# Patient Record
Sex: Male | Born: 1965 | Race: Black or African American | Hispanic: No | Marital: Single | Smoking: Never smoker
Health system: Southern US, Community
[De-identification: ages and names within clinical notes are randomized; demographics above are authoritative.]

## PROBLEM LIST (undated history)

## (undated) DIAGNOSIS — L659 Nonscarring hair loss, unspecified: Secondary | ICD-10-CM

## (undated) HISTORY — DX: Nonscarring hair loss, unspecified: L65.9

## (undated) HISTORY — PX: ANTERIOR CRUCIATE LIGAMENT REPAIR: SHX115

## (undated) HISTORY — PX: LITHOTRIPSY: SUR834

---

## 2008-11-05 ENCOUNTER — Ambulatory Visit: Payer: Self-pay | Admitting: Internal Medicine

## 2009-03-28 ENCOUNTER — Ambulatory Visit: Payer: Self-pay | Admitting: Family Medicine

## 2012-01-27 ENCOUNTER — Ambulatory Visit: Payer: Self-pay | Admitting: Urology

## 2012-02-22 ENCOUNTER — Ambulatory Visit: Payer: Self-pay | Admitting: Urology

## 2012-05-18 ENCOUNTER — Ambulatory Visit: Payer: Self-pay | Admitting: Urology

## 2012-07-06 ENCOUNTER — Ambulatory Visit: Payer: Self-pay | Admitting: Urology

## 2013-07-14 IMAGING — CR DG ABDOMEN 1V
1 series · 2 of 2 positions shown · non-contrast
Comparison: none

REASON FOR EXAM: nephrolithiasis and renal colic
COMMENTS:

[Series 1: supine kub · 0.17mm/px · 2 of 2 slices shown]
[im 1/2]
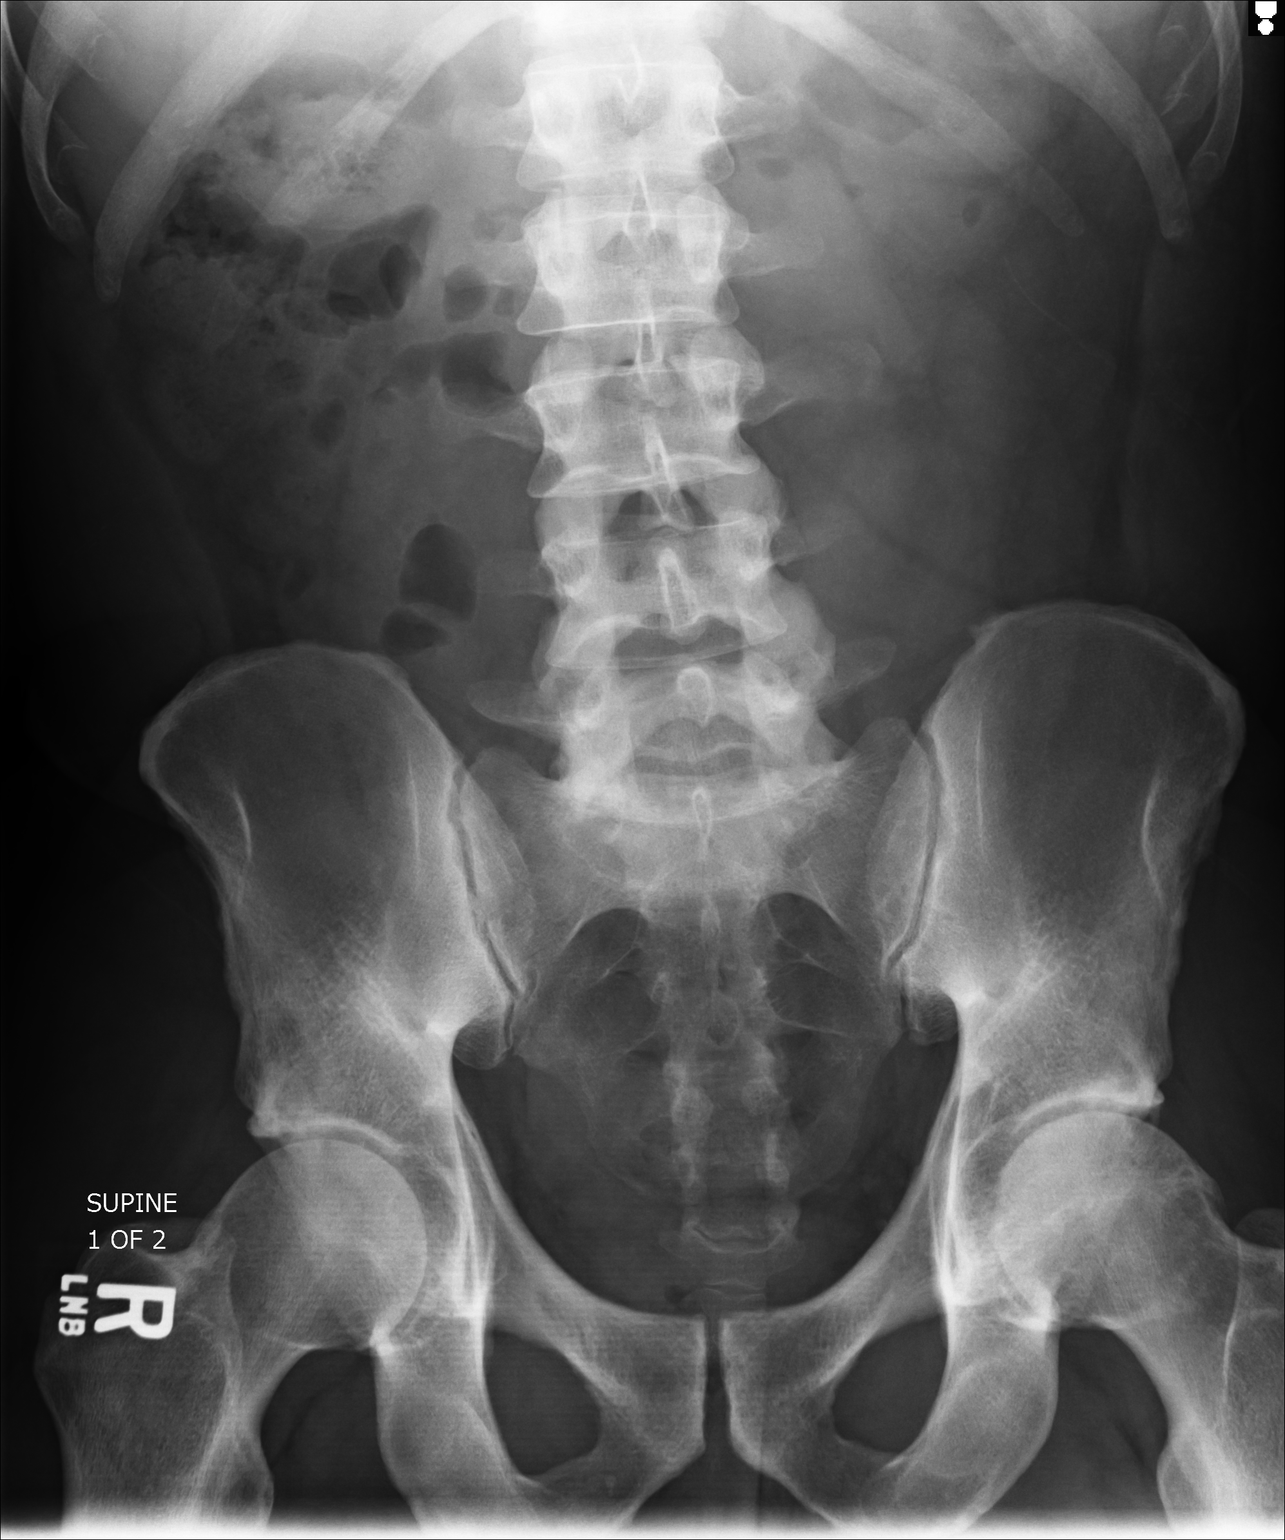
[im 2/2]
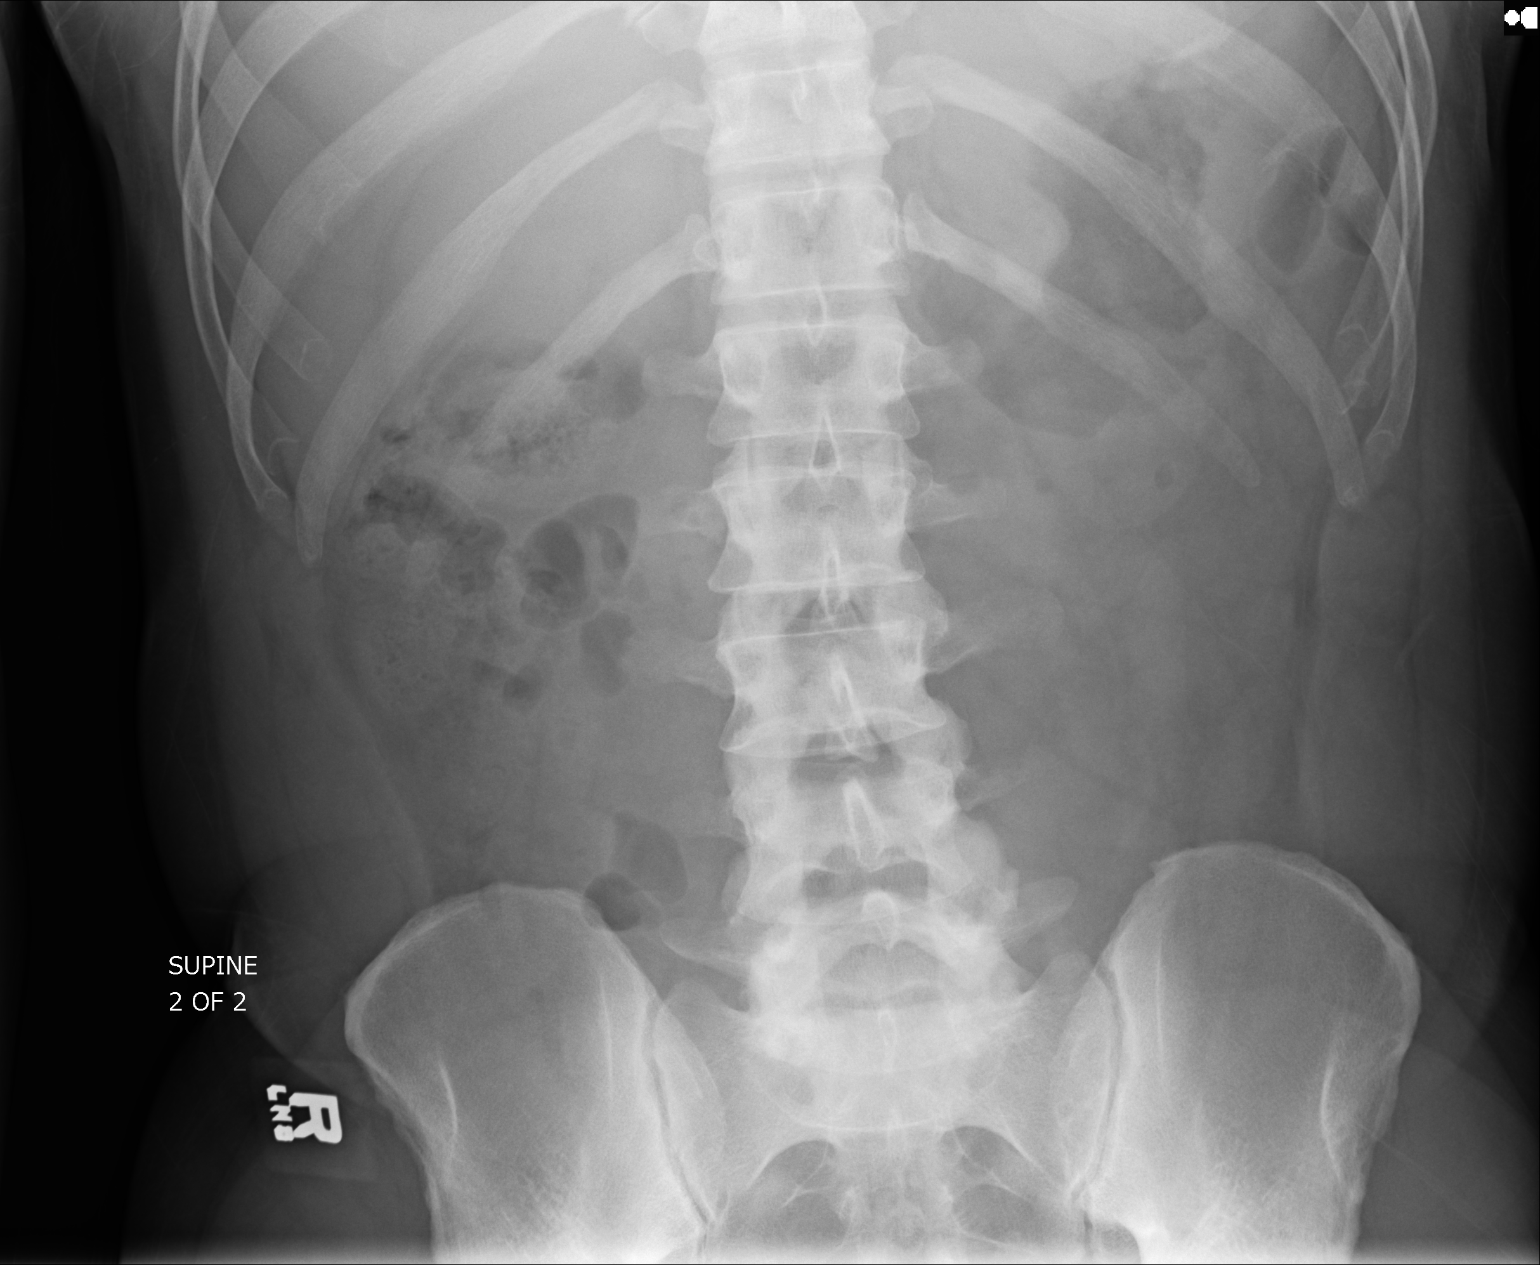

[2 of 2 positions shown; findings below may reference images not displayed]

PROCEDURE:     DXR - DXR KIDNEY URETER BLADDER  - May 18, 2012 [DATE]

RESULT:     Spinal alignment appears to be maintained. The bowel gas pattern
appears grossly normal without obstruction. Portions of the kidneys are not
well seen because of overlying fecal material and bowel gas. No definite
ureteral stones are evident. The previous right ureteral calculus is not
definitely seen.
IMPRESSION: No definite nephrolithiasis or ureteral calculi. CT and ultrasound are
available for further assessment as clinically indicated.

[REDACTED]

## 2014-05-15 ENCOUNTER — Ambulatory Visit: Payer: Self-pay | Admitting: Internal Medicine

## 2014-05-15 LAB — URINALYSIS, COMPLETE
BACTERIA: NEGATIVE
BILIRUBIN, UR: NEGATIVE
GLUCOSE, UR: NEGATIVE mg/dL (ref 0–75)
Ketone: NEGATIVE
NITRITE: NEGATIVE
PROTEIN: NEGATIVE
Ph: 7 (ref 4.5–8.0)
Specific Gravity: 1.005 (ref 1.003–1.030)

## 2014-07-02 ENCOUNTER — Ambulatory Visit: Payer: Self-pay | Admitting: Urology

## 2014-08-01 DIAGNOSIS — R310 Gross hematuria: Secondary | ICD-10-CM | POA: Insufficient documentation

## 2014-08-01 DIAGNOSIS — N200 Calculus of kidney: Secondary | ICD-10-CM | POA: Insufficient documentation

## 2014-08-14 DIAGNOSIS — N201 Calculus of ureter: Secondary | ICD-10-CM | POA: Insufficient documentation

## 2015-11-18 ENCOUNTER — Encounter: Payer: Self-pay | Admitting: Family Medicine

## 2015-11-19 ENCOUNTER — Ambulatory Visit (INDEPENDENT_AMBULATORY_CARE_PROVIDER_SITE_OTHER): Payer: BLUE CROSS/BLUE SHIELD | Admitting: Family Medicine

## 2015-11-19 ENCOUNTER — Encounter: Payer: Self-pay | Admitting: Family Medicine

## 2015-11-19 VITALS — BP 130/78 | HR 98 | Ht 72.0 in | Wt 168.0 lb

## 2015-11-19 DIAGNOSIS — Z1211 Encounter for screening for malignant neoplasm of colon: Secondary | ICD-10-CM | POA: Diagnosis not present

## 2015-11-19 DIAGNOSIS — L700 Acne vulgaris: Secondary | ICD-10-CM

## 2015-11-19 DIAGNOSIS — Z0000 Encounter for general adult medical examination without abnormal findings: Secondary | ICD-10-CM | POA: Diagnosis not present

## 2015-11-19 LAB — HEMOCCULT GUIAC POC 1CARD (OFFICE): FECAL OCCULT BLD: POSITIVE — AB

## 2015-11-19 MED ORDER — TRIAMCINOLONE ACETONIDE 0.1 % EX CREA: 1.0000 "application " | TOPICAL_CREAM | Freq: Two times a day (BID) | CUTANEOUS | Status: DC

## 2015-11-19 MED ORDER — CLOTRIMAZOLE-BETAMETHASONE 1-0.05 % EX CREA: TOPICAL_CREAM | Freq: Two times a day (BID) | CUTANEOUS | Status: DC

## 2015-11-19 MED ORDER — TAZORAC 0.05 % EX CREA: TOPICAL_CREAM | Freq: Every day | CUTANEOUS | Status: DC

## 2015-11-19 NOTE — Progress Notes (Signed)
Name: Joshuwa Vecchio   MRN: [id number]    DOB: 06/28/1966   Date:11/19/2015       Progress Note  Subjective  Chief Complaint  Chief Complaint  Patient presents with  . Annual Exam    HPI Comments: Patient for physical with no subjective/ objective.   No problem-specific assessment & plan notes found for this encounter.   Past Medical History  Diagnosis Date  . Alopecia     Past Surgical History  Procedure Laterality Date  . Anterior cruciate ligament repair Right   . Lithotripsy      History reviewed. No pertinent family history.  Social History   Social History  . Marital Status: Single    Spouse Name: N/A  . Number of Children: N/A  . Years of Education: N/A   Occupational History  . Not on file.   Social History Main Topics  . Smoking status: Never Smoker   . Smokeless tobacco: Not on file  . Alcohol Use: 0.0 oz/week    0 Standard drinks or equivalent per week  . Drug Use: No  . Sexual Activity: Yes   Other Topics Concern  . Not on file   Social History Narrative  . No narrative on file    No Known Allergies   Review of Systems  Constitutional: Negative.   HENT: Negative.   Eyes: Negative.   Respiratory: Negative.   Cardiovascular: Negative.   Gastrointestinal: Negative.   Genitourinary: Negative.   Musculoskeletal: Negative.   Skin: Positive for rash.  Neurological: Negative.   Endo/Heme/Allergies: Negative.   Psychiatric/Behavioral: Negative.      Objective  Filed Vitals:   11/19/15 0908  BP: 130/78  Pulse: 98  Height: 6' (1.829 m)  Weight: 168 lb (76.204 kg)    Physical Exam  Constitutional: He is oriented to person, place, and time and well-developed, well-nourished, and in no distress.  HENT:  Head: Normocephalic.  Right Ear: External ear normal.  Left Ear: External ear normal.  Nose: Nose normal.  Mouth/Throat: Oropharynx is clear and moist.  Eyes: Conjunctivae and EOM are normal. Pupils are equal, round, and  reactive to light. Right eye exhibits no discharge. Left eye exhibits no discharge. No scleral icterus.  Neck: Normal range of motion. Neck supple. No JVD present. No tracheal deviation present. No thyromegaly present.  Cardiovascular: Normal rate, regular rhythm, normal heart sounds and intact distal pulses.  Exam reveals no gallop and no friction rub.   No murmur heard. Pulmonary/Chest: Breath sounds normal. No respiratory distress. He has no wheezes. He has no rales.  Abdominal: Soft. Bowel sounds are normal. He exhibits no mass. There is no hepatosplenomegaly. There is no tenderness. There is no rebound, no guarding and no CVA tenderness.  Genitourinary: Prostate normal. Rectal exam shows no external hemorrhoid, no internal hemorrhoid, no mass and no tenderness. Guaiac positive stool.  Musculoskeletal: Normal range of motion. He exhibits no edema or tenderness.  Lymphadenopathy:    He has no cervical adenopathy.  Neurological: He is alert and oriented to person, place, and time. He has normal sensation, normal strength, normal reflexes and intact cranial nerves. No cranial nerve deficit.  Skin: Skin is warm. No rash noted.  Psychiatric: Mood and affect normal.      Assessment & Plan  Problem List Items Addressed This Visit      Musculoskeletal and Integument   Acne vulgaris    Other Visit Diagnoses    Annual physical exam    -  Primary    Relevant Orders    Lipid Profile    Renal Function Panel    POCT Occult Blood Stool (Completed)    Ambulatory referral to Gastroenterology    Colon cancer screening        Relevant Orders    Ambulatory referral to Gastroenterology         [facility]  11/19/2015

## 2015-12-10 LAB — RENAL FUNCTION PANEL
Albumin: 4.6 g/dL (ref 3.5–5.5)
BUN/Creatinine Ratio: 12 (ref 9–20)
BUN: 12 mg/dL (ref 6–24)
CHLORIDE: 101 mmol/L (ref 96–106)
CO2: 25 mmol/L (ref 18–29)
Calcium: 9.4 mg/dL (ref 8.7–10.2)
Creatinine, Ser: 1.03 mg/dL (ref 0.76–1.27)
GFR, EST AFRICAN AMERICAN: 97 mL/min/{1.73_m2} (ref >59)
GFR, EST NON AFRICAN AMERICAN: 84 mL/min/{1.73_m2} (ref >59)
Glucose: 83 mg/dL (ref 65–99)
POTASSIUM: 4.5 mmol/L (ref 3.5–5.2)
Phosphorus: 3.5 mg/dL (ref 2.5–4.5)
SODIUM: 142 mmol/L (ref 134–144)

## 2015-12-10 LAB — LIPID PANEL
CHOL/HDL RATIO: 3.1 ratio (ref 0.0–5.0)
Cholesterol, Total: 120 mg/dL (ref 100–199)
HDL: 39 mg/dL — ABNORMAL LOW (ref >39)
LDL Calculated: 59 mg/dL (ref 0–99)
Triglycerides: 108 mg/dL (ref 0–149)
VLDL CHOLESTEROL CAL: 22 mg/dL (ref 5–40)

## 2015-12-11 ENCOUNTER — Other Ambulatory Visit: Payer: Self-pay

## 2016-07-22 ENCOUNTER — Other Ambulatory Visit: Payer: Self-pay

## 2016-07-23 ENCOUNTER — Ambulatory Visit (INDEPENDENT_AMBULATORY_CARE_PROVIDER_SITE_OTHER): Payer: BLUE CROSS/BLUE SHIELD | Admitting: Family Medicine

## 2016-07-23 ENCOUNTER — Encounter: Payer: Self-pay | Admitting: Family Medicine

## 2016-07-23 VITALS — BP 138/78 | HR 78 | Ht 72.0 in | Wt 167.0 lb

## 2016-07-23 DIAGNOSIS — N529 Male erectile dysfunction, unspecified: Secondary | ICD-10-CM

## 2016-07-23 DIAGNOSIS — N410 Acute prostatitis: Secondary | ICD-10-CM | POA: Diagnosis not present

## 2016-07-23 MED ORDER — SULFAMETHOXAZOLE-TRIMETHOPRIM 800-160 MG PO TABS: 1.0000 | ORAL_TABLET | Freq: Two times a day (BID) | ORAL | 0 refills | Status: DC

## 2016-07-23 NOTE — Progress Notes (Signed)
Name: James Goodwin   MRN: [id number]    DOB: 1966/11/14   Date:07/23/2016       Progress Note  Subjective  Chief Complaint  Chief Complaint  Patient presents with  . Erectile Dysfunction    Erectile Dysfunction  This is a new problem. The current episode started 1 to 4 weeks ago. The problem has been gradually worsening since onset. The nature of his difficulty is achieving erection and maintaining erection. He reports no anxiety. Irritative symptoms do not include frequency or urgency. Obstructive symptoms include an intermittent stream, a slower stream and a weak stream. Pertinent negatives include no chills, dysuria, hematuria or inability to urinate. Past treatments include sildenafil and tadalafil.    No problem-specific Assessment & Plan notes found for this encounter.   Past Medical History:  Diagnosis Date  . Alopecia     Past Surgical History:  Procedure Laterality Date  . ANTERIOR CRUCIATE LIGAMENT REPAIR Right   . LITHOTRIPSY      Family History  Problem Relation Age of Onset  . Stroke Maternal Grandmother     Social History   Social History  . Marital status: Single    Spouse name: N/A  . Number of children: N/A  . Years of education: N/A   Occupational History  . Not on file.   Social History Main Topics  . Smoking status: Never Smoker  . Smokeless tobacco: Never Used  . Alcohol use 0.0 oz/week  . Drug use: No  . Sexual activity: Yes   Other Topics Concern  . Not on file   Social History Narrative  . No narrative on file    No Known Allergies   Review of Systems  Constitutional: Negative for chills, fever, malaise/fatigue and weight loss.  HENT: Negative for ear discharge, ear pain and sore throat.   Eyes: Negative for blurred vision.  Respiratory: Negative for cough, sputum production, shortness of breath and wheezing.   Cardiovascular: Negative for chest pain, palpitations and leg swelling.  Gastrointestinal: Negative for  abdominal pain, blood in stool, constipation, diarrhea, heartburn, melena and nausea.  Genitourinary: Negative for dysuria, frequency, hematuria and urgency.  Musculoskeletal: Negative for back pain, joint pain, myalgias and neck pain.  Skin: Negative for rash.  Neurological: Negative for dizziness, tingling, sensory change, focal weakness and headaches.  Endo/Heme/Allergies: Negative for environmental allergies and polydipsia. Does not bruise/bleed easily.  Psychiatric/Behavioral: Negative for depression and suicidal ideas. The patient is not nervous/anxious and does not have insomnia.      Objective  Vitals:   07/23/16 1349  BP: 138/78  Pulse: 78  Weight: 167 lb (75.8 kg)  Height: 6' (1.829 m)    Physical Exam  Constitutional: He is oriented to person, place, and time and well-developed, well-nourished, and in no distress.  HENT:  Head: Normocephalic.  Right Ear: External ear normal.  Left Ear: External ear normal.  Nose: Nose normal.  Mouth/Throat: Oropharynx is clear and moist.  Eyes: Conjunctivae and EOM are normal. Pupils are equal, round, and reactive to light. Right eye exhibits no discharge. Left eye exhibits no discharge. No scleral icterus.  Neck: Normal range of motion. Neck supple. No JVD present. No tracheal deviation present. No thyromegaly present.  Cardiovascular: Normal rate, regular rhythm, normal heart sounds and intact distal pulses.  Exam reveals no gallop and no friction rub.   No murmur heard. Pulmonary/Chest: Breath sounds normal. No respiratory distress. He has no wheezes. He has no rales.  Abdominal: Soft. Bowel sounds  are normal. He exhibits no mass. There is no hepatosplenomegaly. There is no tenderness. There is no rebound, no guarding and no CVA tenderness.  Genitourinary: Rectum normal. Prostate is enlarged and tender. He exhibits no abnormal testicular mass, no testicular tenderness, no abnormal scrotal mass and no scrotal tenderness.   Musculoskeletal: Normal range of motion. He exhibits no edema or tenderness.  Lymphadenopathy:    He has no cervical adenopathy.  Neurological: He is alert and oriented to person, place, and time. He has normal sensation, normal strength, normal reflexes and intact cranial nerves. No cranial nerve deficit.  Skin: Skin is warm. No rash noted.  Psychiatric: Mood and affect normal.  Nursing note and vitals reviewed.     Assessment & Plan  Problem List Items Addressed This Visit    None    Visit Diagnoses    Erectile dysfunction, unspecified erectile dysfunction type    -  Primary   Relevant Orders   Testosterone, Free, Total, SHBG   Acute prostatitis       Relevant Medications   sulfamethoxazole-trimethoprim (BACTRIM DS,SEPTRA DS) 800-160 MG tablet        Dr. [facility]  07/23/16

## 2016-07-24 LAB — TESTOSTERONE, FREE, TOTAL, SHBG
SEX HORMONE BINDING: 73 nmol/L (ref 19.3–76.4)
TESTOSTERONE FREE: 7.9 pg/mL (ref 7.2–24.0)
Testosterone: 657 ng/dL (ref 264–916)

## 2016-07-27 ENCOUNTER — Other Ambulatory Visit: Payer: Self-pay

## 2016-07-27 DIAGNOSIS — N529 Male erectile dysfunction, unspecified: Secondary | ICD-10-CM

## 2016-10-22 ENCOUNTER — Other Ambulatory Visit: Payer: Self-pay

## 2016-10-22 ENCOUNTER — Telehealth: Payer: Self-pay

## 2016-10-22 NOTE — Telephone Encounter (Signed)
Gastroenterology Pre-Procedure Review  Request Date: 11/30/16 Requesting Physician: Dr. Yetta BarreJones  PATIENT REVIEW QUESTIONS: The patient responded to the following health history questions as indicated:    1. Are you having any GI issues? no 2. Do you have a personal history of Polyps? no 3. Do you have a family history of Colon Cancer or Polyps? no 4. Diabetes Mellitus? no 5. Joint replacements in the past 12 months?no 6. Major health problems in the past 3 months?no 7. Any artificial heart valves, MVP, or defibrillator?no    MEDICATIONS & ALLERGIES:    Patient reports the following regarding taking any anticoagulation/antiplatelet therapy:   Plavix, Coumadin, Eliquis, Xarelto, Lovenox, Pradaxa, Brilinta, or Effient? no Aspirin? no  Patient confirms/reports the following medications:  Current Outpatient Prescriptions  Medication Sig Dispense Refill  . clotrimazole-betamethasone (LOTRISONE) cream Apply topically 2 (two) times daily. 60 g 11  . sulfamethoxazole-trimethoprim (BACTRIM DS,SEPTRA DS) 800-160 MG tablet Take 1 tablet by mouth 2 (two) times daily. 28 tablet 0  . TAZORAC 0.05 % cream Apply topically at bedtime. Hair club for hair growth 30 g 11  . triamcinolone cream (KENALOG) 0.1 % Apply 1 application topically 2 (two) times daily. 30 g 11   No current facility-administered medications for this visit.     Patient confirms/reports the following allergies:  No Known Allergies  No orders of the defined types were placed in this encounter.   AUTHORIZATION INFORMATION Primary Insurance: 1D#: Group #:  Secondary Insurance: 1D#: Group #:  SCHEDULE INFORMATION: Date: 11/30/16 Time: Location: [place]

## 2016-10-22 NOTE — Telephone Encounter (Signed)
Pt [facility]. Please precert.

## 2016-10-23 NOTE — Telephone Encounter (Signed)
No pre cert is required with [employer].

## 2016-11-26 ENCOUNTER — Telehealth: Payer: Self-pay | Admitting: Gastroenterology

## 2016-11-26 NOTE — Discharge Instructions (Signed)

## 2016-11-26 NOTE — Telephone Encounter (Signed)
Patient left a voice message to cancel colonoscopy monday

## 2016-11-30 ENCOUNTER — Encounter: Admission: RE | Payer: Self-pay | Source: Ambulatory Visit

## 2016-11-30 ENCOUNTER — Ambulatory Visit
Admission: RE | Admit: 2016-11-30 | Payer: BLUE CROSS/BLUE SHIELD | Source: Ambulatory Visit | Admitting: Gastroenterology

## 2016-11-30 SURGERY — COLONOSCOPY WITH PROPOFOL
Anesthesia: Choice

## 2017-01-28 ENCOUNTER — Encounter: Payer: Self-pay | Admitting: Family Medicine

## 2017-01-28 ENCOUNTER — Ambulatory Visit: Payer: BLUE CROSS/BLUE SHIELD | Admitting: Family Medicine

## 2017-01-28 ENCOUNTER — Ambulatory Visit (INDEPENDENT_AMBULATORY_CARE_PROVIDER_SITE_OTHER): Payer: No Typology Code available for payment source | Admitting: Family Medicine

## 2017-01-28 ENCOUNTER — Other Ambulatory Visit: Payer: Self-pay

## 2017-01-28 VITALS — BP 120/80 | HR 70 | Ht 72.0 in | Wt 175.0 lb

## 2017-01-28 DIAGNOSIS — Z3009 Encounter for other general counseling and advice on contraception: Secondary | ICD-10-CM

## 2017-01-28 DIAGNOSIS — M25561 Pain in right knee: Secondary | ICD-10-CM

## 2017-01-28 NOTE — Patient Instructions (Signed)
Vasectomy A vasectomy is tying (with or without cutting) the tube that collects the sperm from the testicle (vas deferens). The vasectomy blocks the sperm from going through the vas deferens and penis so that during sexual intercourse, the sperm does not go into the vagina. Vasectomy is safe, with very rare complications. It does not affect your sexual desire or performance. A vasectomy does not prevent sexually transmitted diseases. Because vasectomy is considered permanent, you should not have it done until you are sure you do not want any more children. You and your partner should be in full agreement to have the procedure. Your decision to have a vasectomy should not be made during a stressful situation. This includes loss of a pregnancy, illness, death of a spouse, or divorce. There are other means of contraception that can be used until you are completely sure you want this procedure done.  LET YOUR HEALTH CARE PROVIDER KNOW ABOUT:   Any allergies you have.  All medicines you are taking, including vitamins, herbs, eye drops, creams, and over-the-counter medicines.  Previous problems you or members of your family have had with the use of anesthetics.  Any blood disorders you have.  Previous surgeries you have had.  Medical conditions you have. RISKS AND COMPLICATIONS Generally, vasectomy is a safe procedure. However, as with any procedure, complications can occur. Possible complications include:  Failure of the procedure to cause infertility. This means you would still be able to get a male pregnant. Even after sterilization has been achieved, there is a 1 in 10,000 chance that the two cut ends may reconnect (recanalization).  Infection. A germ starts growing in the wound. This can usually be treated with antibiotic medicine(s).  An allergic reaction to the anesthetic or other medicine given.  Bleeding. Blood may seep under the skin so that the scrotum and penis appear to be bruised.  Sometimes the scrotum can swell and get the size of a grapefruit. This usually disappears without treatment within a week or two. BEFORE THE PROCEDURE  Do not take aspirin or aspirin-containing products for 7 days prior to your procedure.  Do not take nonsteroidal anti-inflammatory products for 7 days prior to your procedure.  You may be instructed to wash with soap before coming in for your procedure. PROCEDURE  The scrotum is cleaned with bacteria-killing soap, and the health care provider finds the vas deferens.  Each side of the scrotum is numbed.  A very small cut (incision) is made, and the vas deferens are pulled out of the scrotum. The vas deferens are then tied off, cut, or may be burned (cauterized) at the ends.  Sometimes the vas deferens are pulled out from the scrotum through a puncture wound. This is done with a special instrument without an incision.  The vas deferens are then put back into the scrotum, and the incision or puncture wound is closed. Absorbable suture material that will dissolve and not need to be removed is commonly used.  After surgery, sperm may still be left in the vas deferens for 1-3 months. Because of this, other means of contraception should be used until your health care provider examines you and finds there are no sperm in your seminal fluid. AFTER THE PROCEDURE After the procedure, you will be taken to the recovery area. Your progress will be watched and checked. Once you are awake, stable, and taking fluids well, you will be allowed to go home as long as there are no problems.  This information is   not intended to replace advice given to you by your health care provider. Make sure you discuss any questions you have with your health care provider. Document Released: 03/06/2003 Document Revised: 12/19/2013 Document Reviewed: 07/03/2013 Elsevier Interactive Patient Education  2017 [employer]

## 2017-01-28 NOTE — Progress Notes (Signed)
Name: James Goodwin   MRN: [id number]    DOB: 1966-05-31   Date:01/28/2017       Progress Note  Subjective  Chief Complaint  Chief Complaint  Patient presents with  . Knee Pain    was laying in bed Sunday night and felt a pop in R) knee. Tried to stand up- pain was 8 out of 10. Since then, has just been a 2/ more soreness than pain. Does 4 sets of 250 sit ups at a time every day. Can now only do 3 sets before having to stop    Knee Pain   The incident occurred at home. The injury mechanism is unknown (while sleeping /supineto lateral side). The pain is present in the right knee. The quality of the pain is described as aching. The pain is at a severity of 8/10 (at time of unknown injury). The pain is severe. The pain has been improving since onset. Pertinent negatives include no inability to bear weight, loss of motion, loss of sensation, muscle weakness, numbness or tingling. He reports no foreign bodies present. Nothing aggravates the symptoms. Treatments tried: sit ups. The treatment provided mild relief.    No problem-specific Assessment & Plan notes found for this encounter.   Past Medical History:  Diagnosis Date  . Alopecia     Past Surgical History:  Procedure Laterality Date  . ANTERIOR CRUCIATE LIGAMENT REPAIR Right   . LITHOTRIPSY      Family History  Problem Relation Age of Onset  . Stroke Maternal Grandmother     Social History   Social History  . Marital status: Single    Spouse name: N/A  . Number of children: N/A  . Years of education: N/A   Occupational History  . Not on file.   Social History Main Topics  . Smoking status: Never Smoker  . Smokeless tobacco: Never Used  . Alcohol use 0.0 oz/week  . Drug use: No  . Sexual activity: Yes   Other Topics Concern  . Not on file   Social History Narrative  . No narrative on file    No Known Allergies   Review of Systems  Constitutional: Negative for chills, fever, malaise/fatigue and weight  loss.  HENT: Negative for ear discharge, ear pain and sore throat.   Eyes: Negative for blurred vision.  Respiratory: Negative for cough, sputum production, shortness of breath and wheezing.   Cardiovascular: Negative for chest pain, palpitations and leg swelling.  Gastrointestinal: Negative for abdominal pain, blood in stool, constipation, diarrhea, heartburn, melena and nausea.  Genitourinary: Negative for dysuria, frequency, hematuria and urgency.  Musculoskeletal: Negative for back pain, joint pain, myalgias and neck pain.  Skin: Negative for rash.  Neurological: Negative for dizziness, tingling, sensory change, focal weakness, numbness and headaches.  Endo/Heme/Allergies: Negative for environmental allergies and polydipsia. Does not bruise/bleed easily.  Psychiatric/Behavioral: Negative for depression and suicidal ideas. The patient is not nervous/anxious and does not have insomnia.      Objective  Vitals:   01/28/17 1545  BP: 120/80  Pulse: 70  Weight: 175 lb (79.4 kg)  Height: 6' (1.829 m)    Physical Exam  Constitutional: He is oriented to person, place, and time and well-developed, well-nourished, and in no distress.  HENT:  Head: Normocephalic.  Right Ear: External ear normal.  Left Ear: External ear normal.  Nose: Nose normal.  Mouth/Throat: Oropharynx is clear and moist.  Eyes: Conjunctivae and EOM are normal. Pupils are equal, round, and reactive  to light. Right eye exhibits no discharge. Left eye exhibits no discharge. No scleral icterus.  Neck: Normal range of motion. Neck supple. No JVD present. No tracheal deviation present. No thyromegaly present.  Cardiovascular: Normal rate, regular rhythm, normal heart sounds and intact distal pulses.  Exam reveals no gallop and no friction rub.   No murmur heard. Pulmonary/Chest: Breath sounds normal. No respiratory distress. He has no wheezes. He has no rales.  Abdominal: Soft. Bowel sounds are normal. He exhibits no mass.  There is no hepatosplenomegaly. There is no tenderness. There is no rebound, no guarding and no CVA tenderness.  Musculoskeletal: Normal range of motion. He exhibits no edema or tenderness.       Right knee: He exhibits normal range of motion, no LCL laxity and no MCL laxity. No tenderness found. No medial joint line, no lateral joint line, no MCL, no LCL and no patellar tendon tenderness noted.  Lymphadenopathy:    He has no cervical adenopathy.  Neurological: He is alert and oriented to person, place, and time. He has normal sensation, normal strength, normal reflexes and intact cranial nerves. No cranial nerve deficit.  Skin: Skin is warm. No rash noted.  Psychiatric: Mood and affect normal.  Nursing note and vitals reviewed.     Assessment & Plan  Problem List Items Addressed This Visit    None    Visit Diagnoses    Right knee pain, unspecified chronicity    -  Primary   Relevant Orders   DG Knee Complete 4 Views Right   Vasectomy evaluation       Relevant Orders   Ambulatory referral to Urology        Dr. Elizabeth [facility]reanna Jones [facility]  01/28/17

## 2018-06-07 ENCOUNTER — Ambulatory Visit (INDEPENDENT_AMBULATORY_CARE_PROVIDER_SITE_OTHER): Payer: 59 | Admitting: Urology

## 2018-06-07 ENCOUNTER — Encounter: Payer: Self-pay | Admitting: Urology

## 2018-06-07 VITALS — BP 125/71 | HR 77 | Ht 72.0 in | Wt 175.6 lb

## 2018-06-07 DIAGNOSIS — Z87442 Personal history of urinary calculi: Secondary | ICD-10-CM | POA: Diagnosis not present

## 2018-06-07 DIAGNOSIS — Z3009 Encounter for other general counseling and advice on contraception: Secondary | ICD-10-CM

## 2018-06-07 MED ORDER — TAMSULOSIN HCL 0.4 MG PO CAPS: 0.4000 mg | ORAL_CAPSULE | Freq: Every day | ORAL | 0 refills | Status: DC

## 2018-06-07 NOTE — Progress Notes (Signed)
06/07/2018 4:10 PM   James Goodwin [birth year] [id number]  Referring provider: Duanne Limerick, MD [address] [place], [place] [address]  Chief Complaint  Patient presents with  . Establish Care    HPI: 52 year old male with a history of stone disease.  He underwent shockwave lithotripsy of a right ureteral calculus in 2016.  He states over the weekend he was having mild to moderate cramping in his left flank region associated with nausea and 2 episodes of emesis.  His pain and nausea has resolved.  He recently remarried and is interested in a vasectomy as a means of permanent sterilization.   PMH: Past Medical History:  Diagnosis Date  . Alopecia     Surgical History: Past Surgical History:  Procedure Laterality Date  . ANTERIOR CRUCIATE LIGAMENT REPAIR Right   . LITHOTRIPSY      Home Medications:  Allergies as of 06/07/2018   No Known Allergies     Medication List    as of 06/07/2018  4:10 PM   You have not been prescribed any medications.     Allergies: No Known Allergies  Family History: Family History  Problem Relation Age of Onset  . Stroke Maternal Grandmother     Social History:  reports that he has never smoked. He has never used smokeless tobacco. He reports that he drinks alcohol. He reports that he does not use drugs.  ROS: UROLOGY Frequent Urination?: No Hard to postpone urination?: No Burning/pain with urination?: No Get up at night to urinate?: No Leakage of urine?: No Urine stream starts and stops?: No Trouble starting stream?: No Do you have to strain to urinate?: No Blood in urine?: No Urinary tract infection?: No Sexually transmitted disease?: No Injury to kidneys or bladder?: No Painful intercourse?: No Weak stream?: No Erection problems?: No Penile pain?: No  Gastrointestinal Nausea?: Yes Vomiting?: Yes Indigestion/heartburn?: No Diarrhea?: No Constipation?: No  Constitutional Fever: No Night  sweats?: No Weight loss?: No Fatigue?: No  Skin Skin rash/lesions?: No Itching?: No  Eyes Blurred vision?: No Double vision?: No  Ears/Nose/Throat Sore throat?: No Sinus problems?: No  Hematologic/Lymphatic Swollen glands?: No Easy bruising?: No  Cardiovascular Leg swelling?: No Chest pain?: No  Respiratory Cough?: No Shortness of breath?: No  Endocrine Excessive thirst?: No  Musculoskeletal Back pain?: Yes Joint pain?: No  Neurological Headaches?: No Dizziness?: No  Psychologic Depression?: No Anxiety?: No  Physical Exam: BP 125/71 (BP Location: Right Arm, Patient Position: Sitting, Cuff Size: Normal)   Pulse 77   Ht 6' (1.829 m)   Wt 175 lb 9.6 oz (79.7 kg)   SpO2 99%   BMI 23.82 kg/m   Constitutional:  Alert and oriented, No acute distress. HEENT: [place] AT, moist mucus membranes.  Trachea midline, no masses. Cardiovascular: No clubbing, cyanosis, or edema. Respiratory: Normal respiratory effort, no increased work of breathing. GI: Abdomen is soft, nontender, nondistended, no abdominal masses GU: No CVA tenderness Lymph: No cervical or inguinal lymphadenopathy. Skin: No rashes, bruises or suspicious lesions. Neurologic: Grossly intact, no focal deficits, moving all 4 extremities. Psychiatric: Normal mood and affect.   Assessment & Plan:    1. Personal history of kidney stones A KUB was ordered and if no obvious stone will obtain either a renal ultrasound or stone protocol CT based on his symptoms.  2.  Undesired fertility We had a long discussion about vasectomy. We specifically discussed the procedure, recovery and the risks, benefits and alternatives of vasectomy. I explained  that the procedure entails removal of a segment of each vas deferens, each of which conducts sperm, and that the purpose of this procedure is to cause sterility (inability to produce children or cause pregnancy). Vasectomy is intended to be permanent and irreversible form of  contraception. Options for fertility after vasectomy include vasectomy reversal, or sperm retrieval with in vitro fertilization. These options are not always successful, and they may be expensive. We discussed reversible forms of birth control such as condoms, IUD or diaphragms, as well as the option of freezing sperm in a sperm bank prior to the vasectomy procedure. We discussed the importance of avoiding strenuous exercise for four days after vasectomy, and the importance of refraining from any form of ejaculation for seven days after vasectomy. I explained that vasectomy does not produce immediate sterility so another form of contraceptive must be used until sterility is assured by having semen checked for sperm. Thus, a post vasectomy semen analysis is necessary to confirm sterility. Rarely, vasectomy must be repeated. We discussed the approximately 1 in 2,000 risk of pregnancy after vasectomy for men who have post-vasectomy semen analysis showing absent sperm or rare non-motile sperm. Typical side effects include a small amount of oozing blood, some discomfort and mild swelling in the area of incision.  Vasectomy does not affect sexual performance, function, please, sensation, interest, desire, satisfaction, penile erection, volume of semen or ejaculation. Other rare risks include allergy or adverse reaction to an anesthetic, testicular atrophy, hematoma, infection/abscess, prolonged tenderness of the vas deferens, pain, swelling, painful nodule or scar (called sperm granuloma) or epididymtis. We discussed chronic testicular pain syndrome. This has been reported to occur in as many as 1-2% of men and may be permanent. This can be treated with medication, small procedures or (rarely) surgery.     Riki AltesScott C Rigby Leonhardt, MD  [facility] [address], [address] [place], [place] [address] [phone]

## 2018-06-13 ENCOUNTER — Ambulatory Visit
Admission: RE | Admit: 2018-06-13 | Discharge: 2018-06-13 | Disposition: A | Discharge status: Home / Self Care | Payer: No Typology Code available for payment source | Source: Ambulatory Visit | Attending: Urology | Admitting: Urology

## 2018-06-13 DIAGNOSIS — Z87442 Personal history of urinary calculi: Secondary | ICD-10-CM | POA: Insufficient documentation

## 2018-06-14 ENCOUNTER — Telehealth: Payer: Self-pay

## 2018-06-14 ENCOUNTER — Other Ambulatory Visit: Payer: Self-pay | Admitting: Urology

## 2018-06-14 DIAGNOSIS — R109 Unspecified abdominal pain: Secondary | ICD-10-CM

## 2018-06-14 NOTE — Telephone Encounter (Signed)
Patient notified and is going to do the CT scan.

## 2018-06-14 NOTE — Telephone Encounter (Signed)
-----   Message from Riki AltesScott C Stoioff, MD sent at 06/14/2018  7:59 AM EDT ----- There was stool and bowel gas obscuring the renal outlines on KUB and difficult to tell if he has renal calculi.  Would recommend scheduling a noncontrast CT of the abdomen and pelvis.  I went ahead and entered the order.

## 2018-06-14 NOTE — Telephone Encounter (Signed)
-----   Message from Scott C Stoioff, MD sent at 06/14/2018  7:59 AM EDT ----- There was stool and bowel gas obscuring the renal outlines on KUB and difficult to tell if he has renal calculi.  Would recommend scheduling a noncontrast CT of the abdomen and pelvis.  I went ahead and entered the order. 

## 2018-06-20 ENCOUNTER — Ambulatory Visit
Admission: RE | Admit: 2018-06-20 | Discharge: 2018-06-20 | Disposition: A | Discharge status: Home / Self Care | Payer: No Typology Code available for payment source | Source: Ambulatory Visit | Attending: Urology | Admitting: Urology

## 2018-06-20 DIAGNOSIS — R109 Unspecified abdominal pain: Secondary | ICD-10-CM

## 2018-06-20 DIAGNOSIS — N132 Hydronephrosis with renal and ureteral calculous obstruction: Secondary | ICD-10-CM | POA: Diagnosis not present

## 2018-06-20 DIAGNOSIS — N134 Hydroureter: Secondary | ICD-10-CM | POA: Insufficient documentation

## 2018-06-21 ENCOUNTER — Encounter: Payer: Self-pay | Admitting: Urology

## 2018-06-21 ENCOUNTER — Telehealth: Payer: Self-pay | Admitting: Urology

## 2018-06-21 NOTE — Telephone Encounter (Signed)
Pt called to find out results of his CT scan.  He had it yesterday.  Please give pt a call at [phone]

## 2018-06-22 ENCOUNTER — Encounter: Payer: Self-pay | Admitting: Urology

## 2018-06-22 ENCOUNTER — Telehealth: Payer: Self-pay | Admitting: Radiology

## 2018-06-22 NOTE — Telephone Encounter (Signed)
Patient called with frustration that he has not received an answer regarding his CT results.  Explained Dr Heywood FootmanStoioff's note below & advised he will be called back to schedule surgery once discussed with Dr Lonna CobbStoioff. Questions answered. Patient voices understanding.   Notes recorded by Riki AltesStoioff, Scott C, MD on 06/22/2018 at 7:57 AM EDT CT was reviewed and shows a 7 mm stone in the distal ureter near the bladder and a 3 mm stone in the proximal ureter. These were not seen on KUB and would be unable to treat shockwave lithotripsy. Ureteroscopy would be the best option. It is less likely a 7 mm stone will pass.

## 2018-06-22 NOTE — Telephone Encounter (Signed)
-----   Message from Riki AltesScott C Stoioff, MD sent at 06/22/2018  7:57 AM EDT ----- CT was reviewed and shows a 7 mm stone in the distal ureter near the bladder and a 3 mm stone in the proximal ureter.  These were not seen on KUB and would be unable to treat shockwave lithotripsy.  Ureteroscopy would be the best option.  It is less likely a 7 mm stone will pass.

## 2018-06-22 NOTE — Telephone Encounter (Signed)
Addressed in mychart.

## 2018-06-24 ENCOUNTER — Telehealth: Payer: Self-pay | Admitting: Radiology

## 2018-06-24 MED ORDER — OXYCODONE-ACETAMINOPHEN 5-325 MG PO TABS
1.0000 | ORAL_TABLET | ORAL | 0 refills | Status: AC | PRN
Start: 2018-06-24 — End: 2018-06-29

## 2018-06-24 NOTE — Telephone Encounter (Signed)
Patient has been scheduled for ureteroscopy, laser lithotripsy, ureteral stent placement on 07/12/2018. Pt aware. Instructions given. Questions answered. Patient voices understanding.

## 2018-06-24 NOTE — Telephone Encounter (Signed)
Made patient aware of script sent to pharmacy. Patient voices understanding. 

## 2018-06-24 NOTE — Telephone Encounter (Signed)
Patient reports severe pain that is unrelieved by alternating ibuprofen & acetaminophen. Advised patient that if pain becomes unbearable or becomes feverish he should go to the emergency room.   Requests pain medication. Please advise.

## 2018-06-24 NOTE — Telephone Encounter (Signed)
Rx Percocet was sent to his pharmacy

## 2018-06-27 ENCOUNTER — Other Ambulatory Visit: Payer: Self-pay | Admitting: Radiology

## 2018-06-27 DIAGNOSIS — N201 Calculus of ureter: Secondary | ICD-10-CM

## 2018-07-04 NOTE — Telephone Encounter (Signed)
LMOM to notify patient of pre-admit testing appointment & post op appointment with Dr Lonna CobbStoioff. Patient will need to have renal ultrasound prior to this appointment.

## 2018-07-05 ENCOUNTER — Telehealth: Payer: Self-pay | Admitting: Radiology

## 2018-07-05 NOTE — Telephone Encounter (Signed)
Patient states he knows he has multiple stones but has passed a stone that he will bring to the office for analysis.  He is currently scheduled for URS on 07/12/2018. Do you want him to have imaging prior to surgery?

## 2018-07-06 NOTE — Telephone Encounter (Signed)
Explained Dr Heywood FootmanStoioff's note below to patient. Patient would like to f/u with RUS prior. Advised patient to keep appointment already scheduled for post op & hospital will contact him to schedule the RUS. Patient states he will bring stone to office for analysis & voices understanding of conversation with no further questions at this time.

## 2018-07-06 NOTE — Telephone Encounter (Signed)
His most distal ureteral stone was a 7 mm calculus.  He also had a 3 mm ureteral calculus proximal to the larger one.  If he passed the 7 mm calculus he should be able to pass to 3 mm.  The calculi in his kidneys were very small.  If he wants to hold off on ureteroscopy would get a follow-up ultrasound to see if his hydronephrosis has resolved.

## 2018-07-07 ENCOUNTER — Inpatient Hospital Stay: Admission: RE | Admit: 2018-07-07 | Payer: No Typology Code available for payment source | Source: Ambulatory Visit

## 2018-07-12 ENCOUNTER — Ambulatory Visit
Admission: RE | Admit: 2018-07-12 | Payer: No Typology Code available for payment source | Source: Ambulatory Visit | Admitting: Urology

## 2018-07-12 ENCOUNTER — Encounter: Admission: RE | Payer: Self-pay | Source: Ambulatory Visit

## 2018-07-12 SURGERY — CYSTOSCOPY/URETEROSCOPY/HOLMIUM LASER/STENT PLACEMENT
Anesthesia: Choice | Laterality: Left

## 2018-07-15 ENCOUNTER — Other Ambulatory Visit: Payer: Self-pay | Admitting: Urology

## 2018-07-17 NOTE — Progress Notes (Signed)
Stone analysis was mixed calcium oxalate.  He has remaining renal calculi.  Recommend a metabolic evaluation/24 urine study.

## 2018-08-08 ENCOUNTER — Ambulatory Visit: Payer: No Typology Code available for payment source

## 2018-08-12 ENCOUNTER — Ambulatory Visit: Payer: 59 | Admitting: Urology

## 2018-09-20 ENCOUNTER — Ambulatory Visit: Payer: No Typology Code available for payment source

## 2018-09-27 ENCOUNTER — Ambulatory Visit: Payer: 59 | Admitting: Urology

## 2018-09-27 ENCOUNTER — Encounter: Payer: Self-pay | Admitting: Urology

## 2019-08-09 IMAGING — CR DG ABDOMEN 1V
2 series · 2 of 2 positions shown · non-contrast
Comparison: 07/02/2014.

CLINICAL DATA: LEFT lower quadrant pain for 2-3 weeks.

EXAM:
ABDOMEN - 1 VIEW

[abdomen kub (1 of 2)]
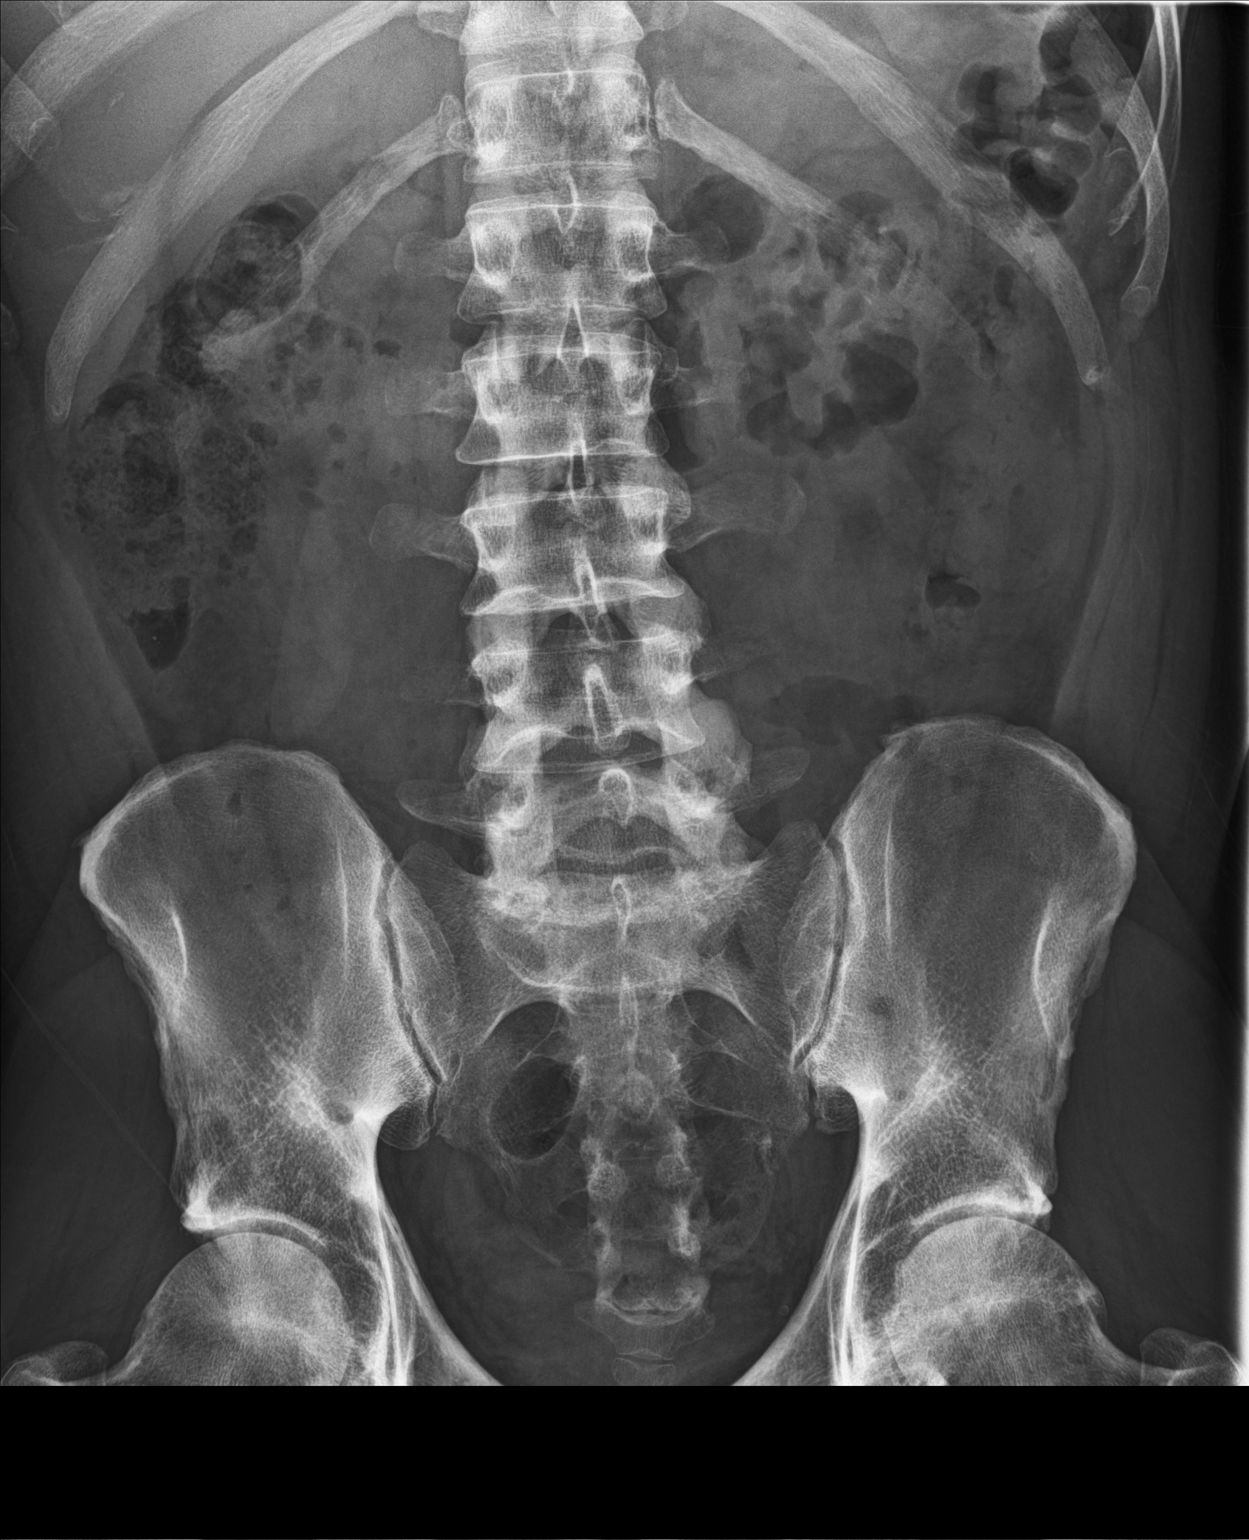

[abdomen kub (2 of 2)]
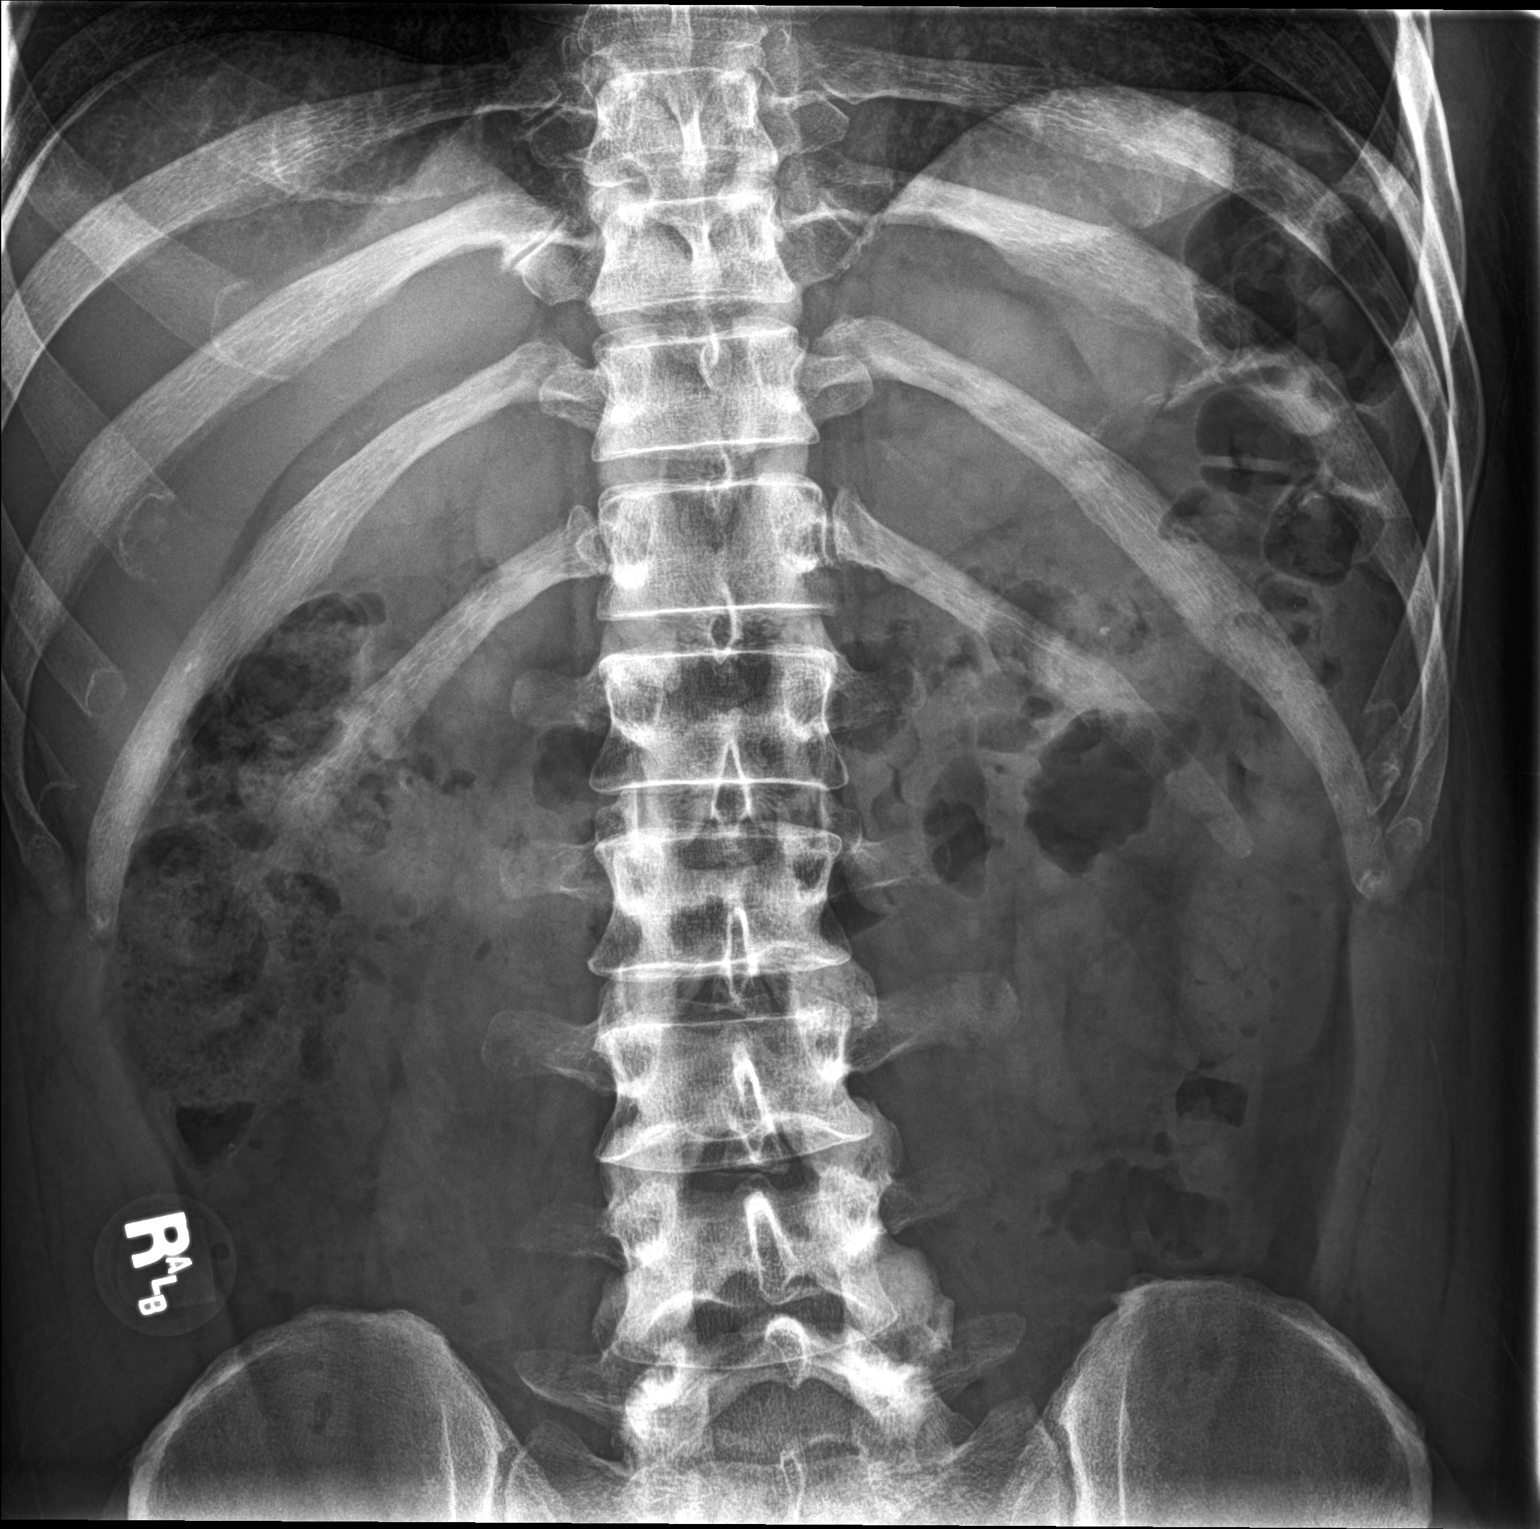

[2 of 2 positions shown; findings below may reference images not displayed]

FINDINGS: The bowel gas pattern is normal. Assessment for radiopaque calculi
is difficult, due to overlying stool, but suspected nephrolithiasis
on the LEFT, possibly BILATERAL. No definite ureteral calculi. Bones
are unremarkable. Previous radiograph demonstrated BILATERAL
nephrolithiasis.
IMPRESSION: Suspected LEFT nephrolithiasis. See discussion above. If further
investigation desired, consider CT urogram.

## 2019-08-16 IMAGING — CT CT RENAL STONE PROTOCOL
1 of 2 series · 15 of 32 positions shown, 19 images · non-contrast
Comparison: None.

CLINICAL DATA: Left-sided abdominal pain.  Left flank pain.

EXAM:
CT ABDOMEN AND PELVIS WITHOUT CONTRAST
TECHNIQUE: Multidetector CT imaging of the abdomen and pelvis was performed
following the standard protocol without IV contrast.

[Series 2: axial st · axial · 0.70mm/px · z∈[-438,-53]mm · 15 of 88 slices shown, 19 images]
[im 7/88  soft-tissue]
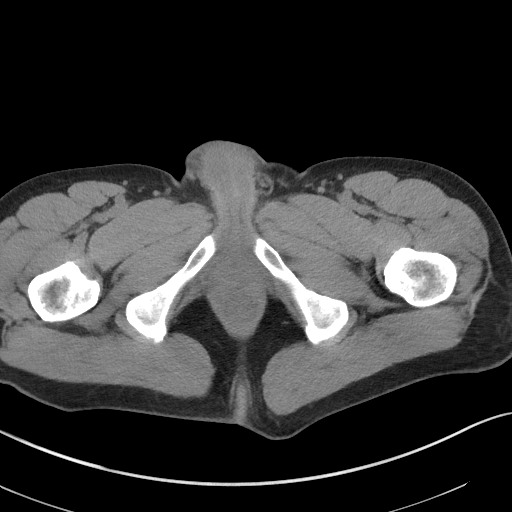
[im 7/88  bone]
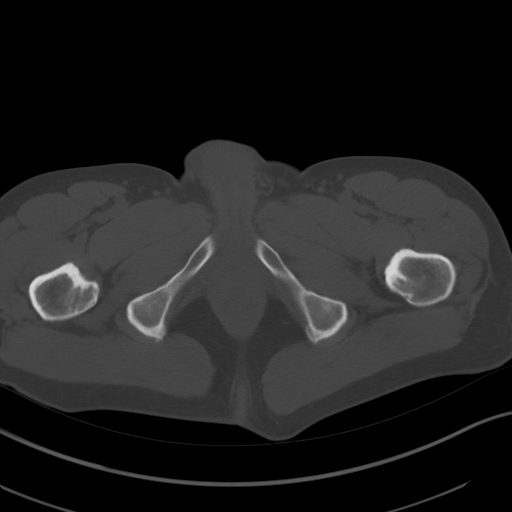
[im 13/88  soft-tissue]
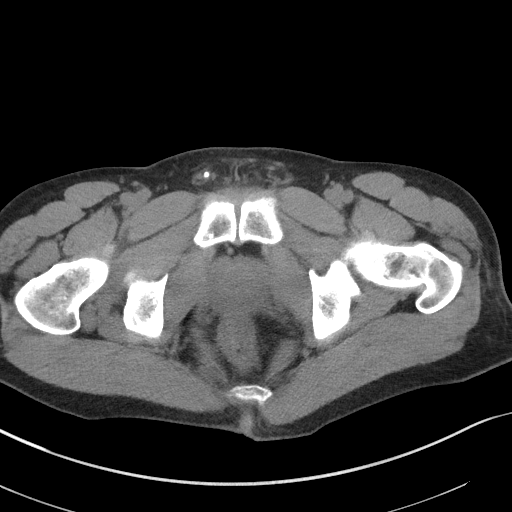
[im 20/88  soft-tissue]
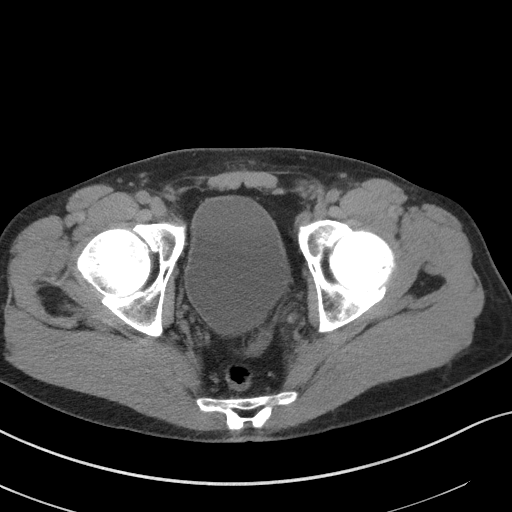
[im 26/88  soft-tissue]
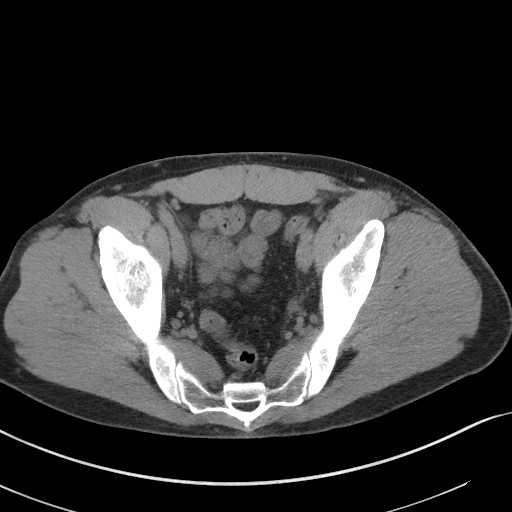
[im 33/88  soft-tissue]
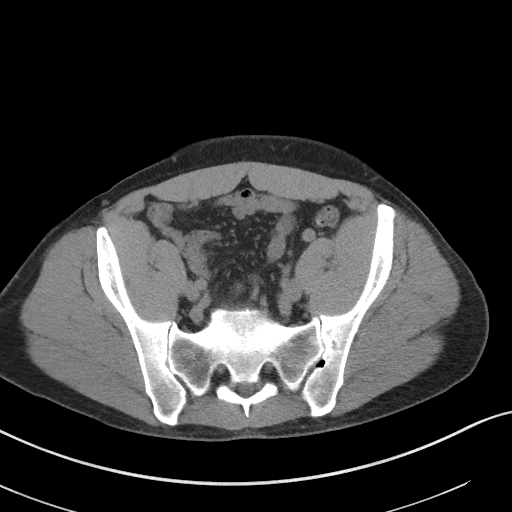
[im 39/88  soft-tissue]
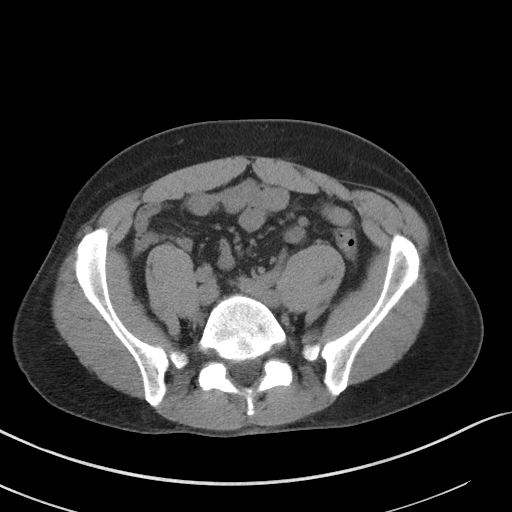
[im 46/88  soft-tissue]
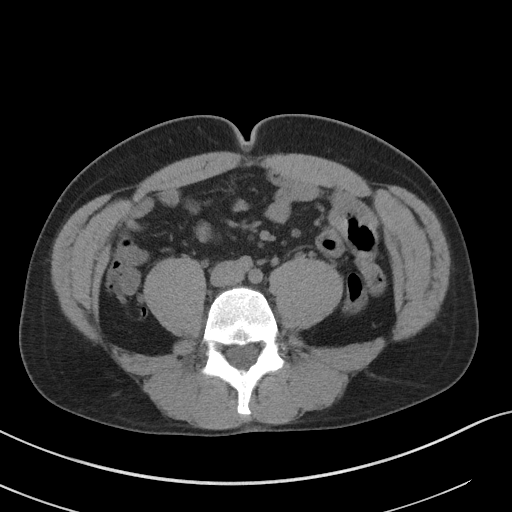
[im 52/88  soft-tissue]
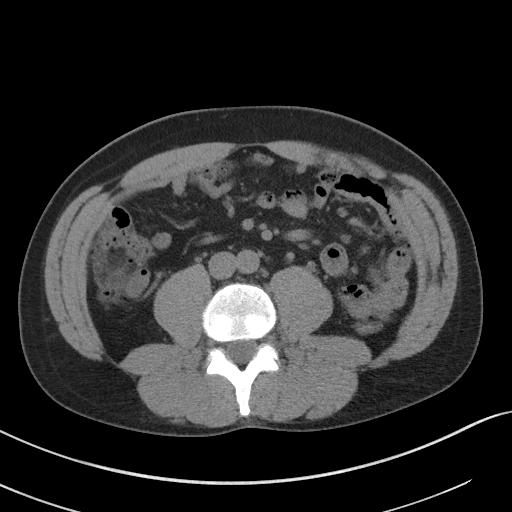
[im 59/88  soft-tissue]
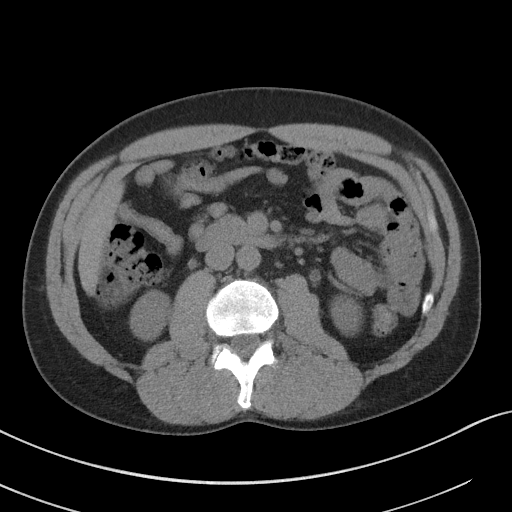
[im 59/88  bone]
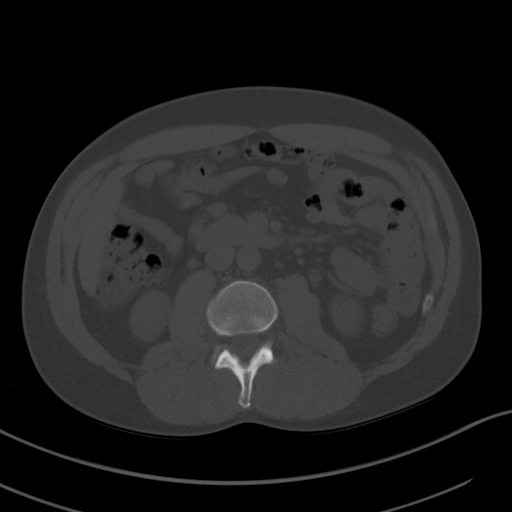
[im 65/88  soft-tissue]
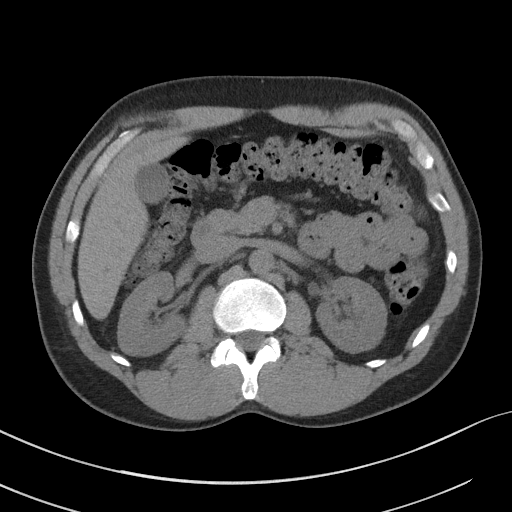
[im 71/88  soft-tissue]
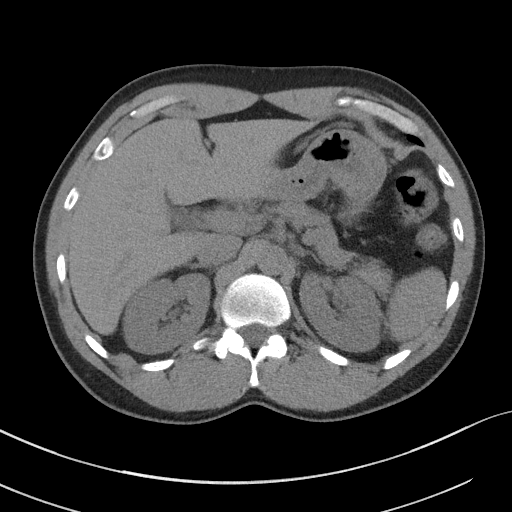
[im 75/88  lung]
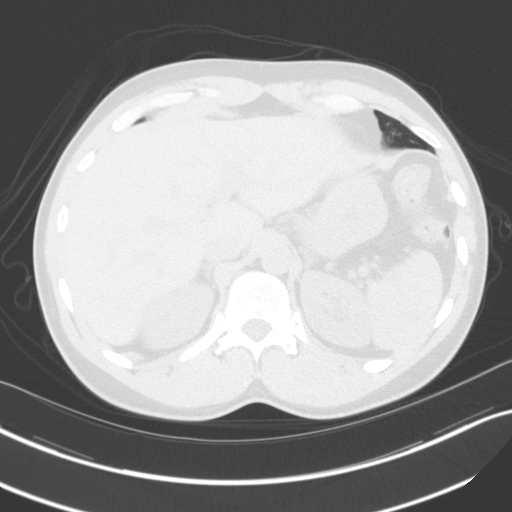
[im 78/88  soft-tissue]
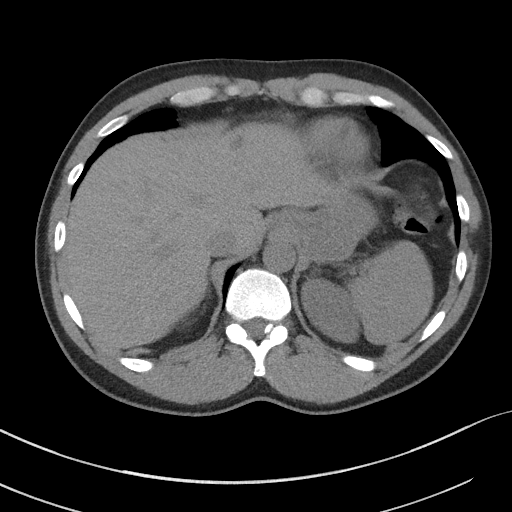
[im 78/88  lung]
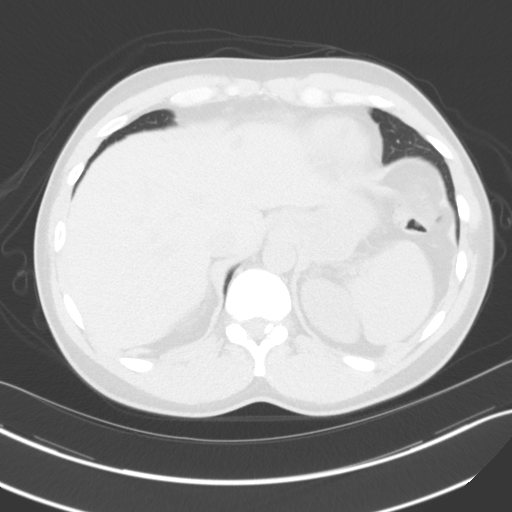
[im 81/88  lung]
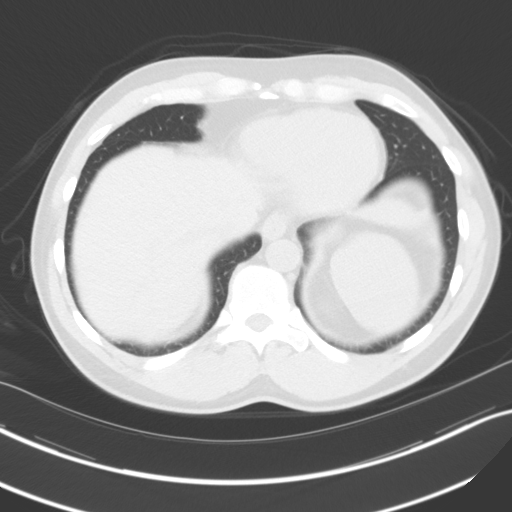
[im 84/88  soft-tissue]
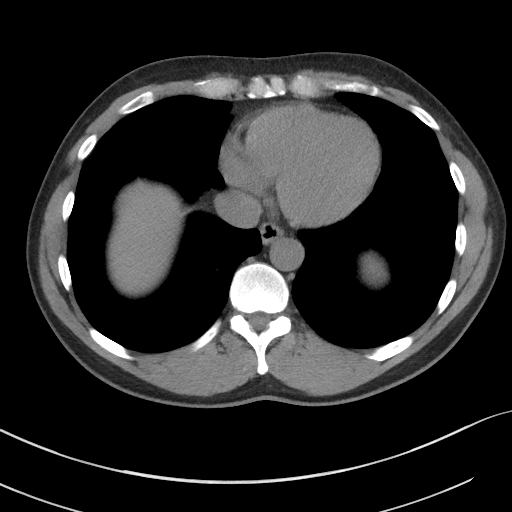
[im 84/88  lung]
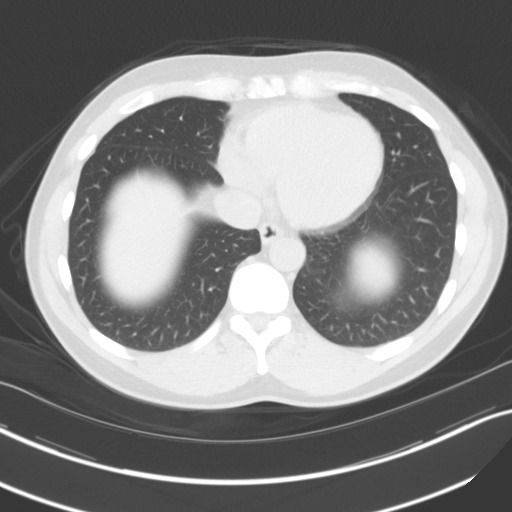

[15 of 32 positions shown; findings below may reference images not displayed]

FINDINGS: Lower chest: No acute abnormality.

Hepatobiliary: No focal liver abnormality is seen. No gallstones,
gallbladder wall thickening, or biliary dilatation.

Pancreas: Unremarkable. No pancreatic ductal dilatation or
surrounding inflammatory changes.

Spleen: Normal in size without focal abnormality.

Adrenals/Urinary Tract: Normal adrenal glands. No obstructive
uropathy. No renal mass. Bilateral nonobstructing nephrolithiasis. 7
mm distal left ureteral calculus resulting in mild left hydroureter.
3 mm proximal left ureteral calculus. Minimal left hydronephrosis.
Bladder is normal.

Stomach/Bowel: Stomach is within normal limits. Appendix appears
normal. No evidence of bowel wall thickening, distention, or
inflammatory changes. Diverticulosis without evidence of
diverticulitis.

Vascular/Lymphatic: No significant vascular findings are present. No
enlarged abdominal or pelvic lymph nodes.

Reproductive: Prostate is unremarkable.

Other: No abdominal wall hernia or abnormality. No abdominopelvic
ascites.

Musculoskeletal: No acute osseous abnormality. No aggressive osseous
lesion. Degenerative disc disease with disc height loss at L5-S1.
IMPRESSION: 1. 7 mm distal left ureteral calculus resulting in mild left
hydroureter. 3 mm proximal left ureteral calculus. Minimal left
hydronephrosis.
2. Bilateral nonobstructing nephrolithiasis.

## 2020-07-17 NOTE — Progress Notes (Signed)
   07/18/2020 2:18 PM   James Goodwin 1966-12-17 [id number]  Referring provider: Duanne Limerick, MD [address] [place],  [place] [address] Chief Complaint  Patient presents with  . Nephrolithiasis    HPI: James Goodwin is a 54 y.o. male with a history of stone disease returns today for suspected recurrent stone.  -Previously followed by me at [facility] from 2015-2017 for stone managment. -Status post shockwave lithotripsy of a right ureteral calculus in 2016. -Seen 06/07/2018 complaining left flank pain -CT renal stone study on 06/20/2018 revealed 7 mm distal left ureteral calculus resulting in mild left hydroureter. 3 mm proximal left ureteral calculus. Minimal left hydronephrosis. Bilateral nonobstructing nephrolithiasis. -Was scheduled for ureteroscopic removal however passed the distal calculi with resolution of his symptoms. -Onset left flank pain/cramping 7/19 with associated episodes of emesis.  -There was 1 episode of gross hematuria on 7/21 -Denies fever, chills  PMH: Past Medical History:  Diagnosis Date  . Alopecia     Surgical History: Past Surgical History:  Procedure Laterality Date  . ANTERIOR CRUCIATE LIGAMENT REPAIR Right   . LITHOTRIPSY      Home Medications:  Allergies as of 07/18/2020   No Known Allergies     Medication List       Accurate as of July 18, 2020  2:18 PM. If you have any questions, ask your nurse or doctor.        tamsulosin 0.4 MG Caps capsule Commonly known as: FLOMAX Take 1 capsule (0.4 mg total) by mouth daily.       Allergies: No Known Allergies  Family History: Family History  Problem Relation Age of Onset  . Stroke Maternal Grandmother     Social History:  reports that he has never smoked. He has never used smokeless tobacco. He reports current alcohol use. He reports that he does not use drugs.   Physical Exam: BP 108/71   Pulse 77   Ht 6' (1.829 m)   Wt 192 lb (87.1 kg)   BMI 26.04 kg/m    Constitutional:  Alert and oriented, No acute distress. HEENT: [place] AT, moist mucus membranes.  Trachea midline, no masses. Cardiovascular: No clubbing, cyanosis, or edema. Respiratory: Normal respiratory effort, no increased work of breathing. Skin: No rashes, bruises or suspicious lesions. Neurologic: Grossly intact, no focal deficits, moving all 4 extremities. Psychiatric: Normal mood and affect.  Urinalysis Micro >30 RBC, 0 WBC  Assessment & Plan:    1.  Nephrolithiasis with renal colic -Probable ureteral calculus -Flomax and toradol Rx sent. -CT A/P scan ordered.   Addendum: Insurance with [facility] out of network and he has requested [facility] referral   [facility] [address] [place], [place] [address] [phone]  I, Theador Hawthorne, am acting as a scribe for Dr. Lorin Picket C. Rufus Cypert,  I have reviewed the above documentation for accuracy and completeness, and I agree with the above.    Riki Altes, MD

## 2020-07-18 ENCOUNTER — Ambulatory Visit (INDEPENDENT_AMBULATORY_CARE_PROVIDER_SITE_OTHER): Payer: BLUE CROSS/BLUE SHIELD | Admitting: Urology

## 2020-07-18 ENCOUNTER — Other Ambulatory Visit: Payer: Self-pay

## 2020-07-18 ENCOUNTER — Encounter: Payer: Self-pay | Admitting: Urology

## 2020-07-18 VITALS — BP 108/71 | HR 77 | Ht 72.0 in | Wt 192.0 lb

## 2020-07-18 DIAGNOSIS — R310 Gross hematuria: Secondary | ICD-10-CM

## 2020-07-18 DIAGNOSIS — N23 Unspecified renal colic: Secondary | ICD-10-CM

## 2020-07-18 DIAGNOSIS — N200 Calculus of kidney: Secondary | ICD-10-CM | POA: Diagnosis not present

## 2020-07-18 LAB — MICROSCOPIC EXAMINATION
Bacteria, UA: NONE SEEN
Epithelial Cells (non renal): NONE SEEN /hpf (ref 0–10)
RBC, Urine: >30 /hpf — ABNORMAL HIGH (ref 0–2)
WBC, UA: NONE SEEN /hpf (ref 0–5)

## 2020-07-18 LAB — URINALYSIS, COMPLETE
Bilirubin, UA: NEGATIVE
Glucose, UA: NEGATIVE
Ketones, UA: NEGATIVE
Nitrite, UA: NEGATIVE
Specific Gravity, UA: 1.015 (ref 1.005–1.030)
Urobilinogen, Ur: 0.2 mg/dL (ref 0.2–1.0)
pH, UA: 6 (ref 5.0–7.5)

## 2020-07-18 MED ORDER — TAMSULOSIN HCL 0.4 MG PO CAPS: 0.4000 mg | ORAL_CAPSULE | Freq: Every day | ORAL | 0 refills | Status: DC

## 2020-07-18 MED ORDER — KETOROLAC TROMETHAMINE 10 MG PO TABS: 10.0000 mg | ORAL_TABLET | Freq: Four times a day (QID) | ORAL | 0 refills | Status: DC | PRN

## 2020-07-19 ENCOUNTER — Encounter: Payer: Self-pay | Admitting: Urology

## 2021-04-29 ENCOUNTER — Ambulatory Visit: Payer: 59 | Admitting: Family Medicine

## 2021-04-29 ENCOUNTER — Other Ambulatory Visit: Payer: Self-pay

## 2021-04-29 ENCOUNTER — Encounter: Payer: Self-pay | Admitting: Family Medicine

## 2021-04-29 VITALS — BP 120/80 | HR 72 | Ht 72.0 in | Wt 173.0 lb

## 2021-04-29 DIAGNOSIS — L234 Allergic contact dermatitis due to dyes: Secondary | ICD-10-CM | POA: Diagnosis not present

## 2021-04-29 DIAGNOSIS — Z1211 Encounter for screening for malignant neoplasm of colon: Secondary | ICD-10-CM | POA: Diagnosis not present

## 2021-04-29 DIAGNOSIS — L03211 Cellulitis of face: Secondary | ICD-10-CM | POA: Diagnosis not present

## 2021-04-29 MED ORDER — PREDNISONE 10 MG PO TABS: ORAL_TABLET | ORAL | 1 refills | Status: DC

## 2021-04-29 MED ORDER — HYDROXYZINE HCL 10 MG PO TABS: 10.0000 mg | ORAL_TABLET | Freq: Three times a day (TID) | ORAL | 0 refills | Status: DC | PRN

## 2021-04-29 MED ORDER — CEPHALEXIN 500 MG PO CAPS: 500.0000 mg | ORAL_CAPSULE | Freq: Four times a day (QID) | ORAL | 0 refills | Status: DC

## 2021-04-29 NOTE — Progress Notes (Signed)
Date:  04/29/2021   Name:  James Goodwin   DOB:  25-Feb-1966   MRN:  [id number]   Chief Complaint: dye allergy Annia Friendly on Sunday - rash noticeable on that afternoon- itching and red bumps around beard. Also some drainage from places)  Rash This is a new problem. The current episode started in the past 7 days (sunday evening). The problem is unchanged. The affected locations include the face. The rash is characterized by burning, draining, itchiness, redness and swelling. Associated with: hair dye. Pertinent negatives include no anorexia, congestion, cough, diarrhea, eye pain, facial edema, fatigue, fever, joint pain, nail changes, rhinorrhea, shortness of breath, sore throat or vomiting. Past treatments include topical steroids, anti-itch cream and antihistamine. The treatment provided moderate relief.    Lab Results  Component Value Date   CREATININE 1.03 12/09/2015   BUN 12 12/09/2015   NA 142 12/09/2015   K 4.5 12/09/2015   CL 101 12/09/2015   CO2 25 12/09/2015   Lab Results  Component Value Date   CHOL 120 12/09/2015   HDL 39 (L) 12/09/2015   LDLCALC 59 12/09/2015   TRIG 108 12/09/2015   CHOLHDL 3.1 12/09/2015   No results found for: TSH No results found for: HGBA1C No results found for: WBC, HGB, HCT, MCV, PLT No results found for: ALT, AST, GGT, ALKPHOS, BILITOT   Review of Systems  Constitutional: Negative for chills, fatigue and fever.  HENT: Negative for congestion, drooling, ear discharge, ear pain, rhinorrhea and sore throat.   Eyes: Negative for pain.  Respiratory: Negative for cough, shortness of breath and wheezing.   Cardiovascular: Negative for chest pain, palpitations and leg swelling.  Gastrointestinal: Negative for abdominal pain, anorexia, blood in stool, constipation, diarrhea, nausea and vomiting.  Endocrine: Negative for polydipsia.  Genitourinary: Negative for dysuria, frequency, hematuria and urgency.  Musculoskeletal: Negative for back pain,  joint pain, myalgias and neck pain.  Skin: Positive for rash. Negative for nail changes.  Allergic/Immunologic: Negative for environmental allergies.  Neurological: Negative for dizziness and headaches.  Hematological: Does not bruise/bleed easily.  Psychiatric/Behavioral: Negative for suicidal ideas. The patient is not nervous/anxious.     Patient Active Problem List   Diagnosis Date Noted  . Personal history of kidney stones 06/07/2018  . Acne vulgaris 11/19/2015  . Calculi, ureter 08/14/2014  . Frank hematuria 08/01/2014  . Calculus of kidney 08/01/2014    No Known Allergies  Past Surgical History:  Procedure Laterality Date  . ANTERIOR CRUCIATE LIGAMENT REPAIR Right   . LITHOTRIPSY      Social History   Tobacco Use  . Smoking status: Never Smoker  . Smokeless tobacco: Never Used  Substance Use Topics  . Alcohol use: Yes    Alcohol/week: 0.0 standard drinks  . Drug use: No     Medication list has been reviewed and updated.  Current Meds  Medication Sig  . diphenhydrAMINE (BENADRYL) 25 mg capsule Take 25 mg by mouth every 6 (six) hours as needed.    PHQ 2/9 Scores 04/29/2021  PHQ - 2 Score 0    No flowsheet data found.  BP Readings from Last 3 Encounters:  04/29/21 120/80  07/18/20 108/71  06/07/18 125/71    Physical Exam Vitals and nursing note reviewed.  HENT:     Head: Normocephalic.     Right Ear: Tympanic membrane, ear canal and external ear normal.     Left Ear: Tympanic membrane, ear canal and external ear normal.  Nose: Nose normal. No congestion or rhinorrhea.     Mouth/Throat:     Mouth: Mucous membranes are moist.  Eyes:     General: No scleral icterus.       Right eye: No discharge.        Left eye: No discharge.     Conjunctiva/sclera: Conjunctivae normal.     Pupils: Pupils are equal, round, and reactive to light.  Neck:     Thyroid: No thyromegaly.     Vascular: No JVD.     Trachea: No tracheal deviation.  Cardiovascular:      Rate and Rhythm: Normal rate and regular rhythm.     Heart sounds: Normal heart sounds. No murmur heard. No friction rub. No gallop.   Pulmonary:     Effort: No respiratory distress.     Breath sounds: Normal breath sounds. No wheezing, rhonchi or rales.  Abdominal:     Palpations: Abdomen is soft.  Musculoskeletal:        General: No tenderness. Normal range of motion.     Cervical back: Normal range of motion and neck supple.  Lymphadenopathy:     Cervical: No cervical adenopathy.  Skin:    General: Skin is warm.     Findings: Erythema and rash present. Rash is urticarial.     Comments: drainage  Neurological:     Mental Status: He is alert and oriented to person, place, and time.     Deep Tendon Reflexes: Reflexes are normal and symmetric.     Wt Readings from Last 3 Encounters:  04/29/21 173 lb (78.5 kg)  07/18/20 192 lb (87.1 kg)  06/07/18 175 lb 9.6 oz (79.7 kg)    BP 120/80   Pulse 72   Ht 6' (1.829 m)   Wt 173 lb (78.5 kg)   BMI 23.46 kg/m   Assessment and Plan: 1. 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  Persistent.  Patient applied her diet through his beard area and within 12 to 18 hours noted local irritation and swelling of the facial area.  He washed it immediately with [place] soap however continues to have swelling erythema and pruritus.  This is consistent with a contact dermatitis and we will treat with prednisone taper 60 mg over a 14-day.  Is also been suggested that the patient use Zyrtec during the day and hydroxyzine has been prescribed to take at night for itching.  There is some mild erythema and some well pain with drainage from the beard area consistent with possible early cellulitis.  Given the facial area we will initiate Keflex 500 mg 4 times a day for 7 days. - predniSONE (DELTASONE) 10 MG tablet; Taper 6,6,6,5,5,5,4,4,3,3,2,2,1,1  Dispense: 53 tablet; Refill: 1 - hydrOXYzine (ATARAX/VISTARIL) 10 MG tablet; Take 1 tablet (10 mg total)  by mouth 3 (three) times daily as needed.  Dispense: 30 tablet; Refill: 0  2. Cellulitis of face Patient has new onset of erythema and tenderness in the facial area consistent with early cellulitis and we will treat with cephalexin 500 mg 4 times a day. - cephALEXin (KEFLEX) 500 MG capsule; Take 1 capsule (500 mg total) by mouth 4 (four) times daily.  Dispense: 28 capsule; Refill: 0  3. Colon cancer screening Discussion with patient and he has agreed to have an FIT. - Fecal occult blood, imunochemical([facility])

## 2021-05-20 ENCOUNTER — Other Ambulatory Visit: Payer: Self-pay

## 2021-05-20 ENCOUNTER — Telehealth: Payer: Self-pay

## 2021-05-20 DIAGNOSIS — L700 Acne vulgaris: Secondary | ICD-10-CM

## 2021-05-20 MED ORDER — CLOTRIMAZOLE-BETAMETHASONE 1-0.05 % EX CREA: TOPICAL_CREAM | Freq: Two times a day (BID) | CUTANEOUS | 0 refills | Status: DC

## 2021-05-20 NOTE — Telephone Encounter (Signed)
Called pt to turn in his FIT test. He wanted Clotrimazole cream sent in for acne vulgaris. I sent in 30g tube and then will need to see to get more refills

## 2021-05-20 NOTE — Progress Notes (Unsigned)
Sent in clotrimazole- need to see next time

## 2021-07-24 ENCOUNTER — Other Ambulatory Visit: Payer: Self-pay | Admitting: Family Medicine

## 2021-07-24 ENCOUNTER — Telehealth: Payer: Self-pay

## 2021-07-24 DIAGNOSIS — L03211 Cellulitis of face: Secondary | ICD-10-CM

## 2021-07-24 NOTE — Telephone Encounter (Signed)
Copied from CRM [id number]. Topic: General - Other >> Jul 24, 2021  8:55 AM Leafy Ro wrote: Reason for CRM: Pt is calling and needs another stool  card. Pt was getting his car clean  out and they throw away the card

## 2021-07-24 NOTE — Telephone Encounter (Signed)
Requested medication (s) are due for refill today: no  Requested medication (s) are on the active medication list:  yes   Last refill:  04/29/2021  Future visit scheduled:no  Notes to clinic:  Medication not assigned to a protocol, review manually.   Requested Prescriptions  Pending Prescriptions Disp Refills   cephALEXin (KEFLEX) 500 MG capsule [Pharmacy Med Name: CEPHALEXIN 500MG  CAPSULES] 28 capsule 0    Sig: TAKE 1 CAPSULE(500 MG) BY MOUTH FOUR TIMES DAILY      Off-Protocol Failed - 07/24/2021  9:07 AM      Failed - Medication not assigned to a protocol, review manually.      Passed - Valid encounter within last 12 months    Recent Outpatient Visits           2 months ago Allergic contact dermatitis due to dyes   [facility], [facility]   4 years ago Right knee pain, unspecified chronicity   [facility] Duanne Limerick, MD   5 years ago Erectile dysfunction, unspecified erectile dysfunction type   [facility], MD   5 years ago Annual physical exam   [facility], MD

## 2021-07-25 ENCOUNTER — Ambulatory Visit: Payer: 59 | Admitting: Family Medicine

## 2021-07-25 NOTE — Progress Notes (Deleted)
    Date:  07/25/2021   Name:  James Goodwin   DOB:  01-05-1966   MRN:  [id number]   Chief Complaint: No chief complaint on file.  HPI  Lab Results  Component Value Date   CREATININE 1.03 12/09/2015   BUN 12 12/09/2015   NA 142 12/09/2015   K 4.5 12/09/2015   CL 101 12/09/2015   CO2 25 12/09/2015   Lab Results  Component Value Date   CHOL 120 12/09/2015   HDL 39 (L) 12/09/2015   LDLCALC 59 12/09/2015   TRIG 108 12/09/2015   CHOLHDL 3.1 12/09/2015   No results found for: TSH No results found for: HGBA1C No results found for: WBC, HGB, HCT, MCV, PLT No results found for: ALT, AST, GGT, ALKPHOS, BILITOT   Review of Systems  Patient Active Problem List   Diagnosis Date Noted   Personal history of kidney stones 06/07/2018   Acne vulgaris 11/19/2015   Calculi, 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  No outpatient medications have been marked as taking for the 07/25/21 encounter (Appointment) with Duanne Limerick, MD.    [facility] 2/9 Scores 04/29/2021  PHQ - 2 Score 0    No flowsheet data found.  BP Readings from Last 3 Encounters:  04/29/21 120/80  07/18/20 108/71  06/07/18 125/71    Physical Exam  Wt Readings from Last 3 Encounters:  04/29/21 173 lb (78.5 kg)  07/18/20 192 lb (87.1 kg)  06/07/18 175 lb 9.6 oz (79.7 kg)    There were no vitals taken for this visit.  Assessment and Plan:

## 2022-01-21 ENCOUNTER — Ambulatory Visit (INDEPENDENT_AMBULATORY_CARE_PROVIDER_SITE_OTHER): Payer: 59 | Admitting: Family Medicine

## 2022-01-21 ENCOUNTER — Encounter: Payer: Self-pay | Admitting: Family Medicine

## 2022-01-21 ENCOUNTER — Other Ambulatory Visit: Payer: Self-pay

## 2022-01-21 VITALS — BP 122/70 | HR 100 | Ht 72.0 in | Wt 175.0 lb

## 2022-01-21 DIAGNOSIS — L738 Other specified follicular disorders: Secondary | ICD-10-CM

## 2022-01-21 DIAGNOSIS — L209 Atopic dermatitis, unspecified: Secondary | ICD-10-CM | POA: Diagnosis not present

## 2022-01-21 DIAGNOSIS — L0102 Bockhart's impetigo: Secondary | ICD-10-CM

## 2022-01-21 MED ORDER — CEPHALEXIN 500 MG PO CAPS: 500.0000 mg | ORAL_CAPSULE | Freq: Four times a day (QID) | ORAL | 0 refills | Status: DC

## 2022-01-21 MED ORDER — MUPIROCIN 2 % EX OINT: 1.0000 | TOPICAL_OINTMENT | Freq: Two times a day (BID) | CUTANEOUS | 3 refills | Status: DC

## 2022-01-21 NOTE — Progress Notes (Signed)
Date:  01/21/2022   Name:  James Goodwin   DOB:  03/22/1966   MRN:  [id number]   Chief Complaint: Rash (Refill Keflex- hair dye broke face out)  Rash Pertinent negatives include no cough, diarrhea, fever, shortness of breath or sore throat.   Lab Results  Component Value Date   NA 142 12/09/2015   K 4.5 12/09/2015   CO2 25 12/09/2015   GLUCOSE 83 12/09/2015   BUN 12 12/09/2015   CREATININE 1.03 12/09/2015   CALCIUM 9.4 12/09/2015   GFRNONAA 84 12/09/2015   Lab Results  Component Value Date   CHOL 120 12/09/2015   HDL 39 (L) 12/09/2015   LDLCALC 59 12/09/2015   TRIG 108 12/09/2015   CHOLHDL 3.1 12/09/2015   No results found for: TSH No results found for: HGBA1C No results found for: WBC, HGB, HCT, MCV, PLT No results found for: ALT, AST, GGT, ALKPHOS, BILITOT No results found for: 25OHVITD2, 25OHVITD3, VD25OH   Review of Systems  Constitutional:  Negative for chills and fever.  HENT:  Negative for drooling, ear discharge, ear pain and sore throat.   Respiratory:  Negative for cough, shortness of breath and wheezing.   Cardiovascular:  Negative for chest pain, palpitations and leg swelling.  Gastrointestinal:  Negative for abdominal pain, blood in stool, constipation, diarrhea and nausea.  Endocrine: Negative for polydipsia.  Genitourinary:  Negative for dysuria, frequency, hematuria and urgency.  Musculoskeletal:  Negative for back pain, myalgias and neck pain.  Skin:  Positive for rash.  Allergic/Immunologic: Negative for environmental allergies.  Neurological:  Negative for dizziness and headaches.  Hematological:  Does not bruise/bleed easily.  Psychiatric/Behavioral:  Negative for suicidal ideas. The patient is not nervous/anxious.    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  Current Meds  Medication Sig   clotrimazole-betamethasone (LOTRISONE) cream Apply topically 2 (two) times daily.   diphenhydrAMINE (BENADRYL) 25 mg capsule Take 25 mg by mouth every 6 (six) hours as needed.   hydrOXYzine (ATARAX/VISTARIL) 10 MG tablet Take 1 tablet (10 mg total) by mouth 3 (three) times daily as needed.    PHQ 2/9 Scores 04/29/2021  PHQ - 2 Score 0    No flowsheet data found.  BP Readings from Last 3 Encounters:  01/21/22 122/70  04/29/21 120/80  07/18/20 108/71    Physical Exam Vitals and nursing note reviewed.  Skin:    Findings: Erythema and rash present. Rash is crusting.     Comments: Honey crusted beard area/    Wt Readings from Last 3 Encounters:  01/21/22 175 lb (79.4 kg)  04/29/21 173 lb (78.5 kg)  07/18/20 192 lb (87.1 kg)    BP 122/70    Pulse 100    Ht 6' (1.829 m)    Wt 175 lb (79.4 kg)    BMI 23.73 kg/m  Assessment and Plan:  1. Folliculitis barbae New onset.  Recurrent.  Stable.  Patient has similar presentation as seen before with follicular irritation and crusting within the beard area.  This is consistent with a staph infection and it probably is associated more so with cutting and razor application.  We will treat with Bactroban and cephalexin. - mupirocin ointment (BACTROBAN) 2 %; Apply 1 application topically 2 (two) times daily.  Dispense: 22 g; Refill: 3 - cephALEXin (KEFLEX) 500 MG capsule; Take 1 capsule (500 mg total) by mouth 4 (four) times daily.  Dispense: 40 capsule; Refill: 0  2. Impetigo follicularis As noted above crusting is noted and there is  likely an impetigo component requiring antibiotic topical and oral medication as well.  Patient is also been informed to prepare the site in the future should he do close cutting or razor application to reduce his risk of reinfection. - mupirocin ointment (BACTROBAN) 2 %; Apply 1 application topically 2 (two) times daily.  Dispense: 22 g; Refill: 3 - cephALEXin (KEFLEX) 500 MG capsule; Take 1 capsule (500 mg total) by mouth 4 (four) times daily.  Dispense: 40 capsule; Refill: 0  3. Atopic dermatitis, unspecified type Small area on the left arm flexor surface consistent with a contact dermatitis and will treat with a topical steroid patient has in his possession.

## 2023-02-05 ENCOUNTER — Encounter: Payer: Self-pay | Admitting: Family Medicine

## 2023-02-05 ENCOUNTER — Ambulatory Visit: Payer: 59 | Admitting: Family Medicine

## 2023-02-05 VITALS — BP 128/78 | HR 95 | Ht 72.0 in | Wt 174.0 lb

## 2023-02-05 DIAGNOSIS — N342 Other urethritis: Secondary | ICD-10-CM

## 2023-02-05 LAB — POCT URINALYSIS DIPSTICK
Bilirubin, UA: NEGATIVE
Glucose, UA: NEGATIVE
Ketones, UA: NEGATIVE
Leukocytes, UA: NEGATIVE
Nitrite, UA: NEGATIVE
Protein, UA: NEGATIVE
Spec Grav, UA: 1.02 (ref 1.010–1.025)
Urobilinogen, UA: 0.2 E.U./dL
pH, UA: 7.5 (ref 5.0–8.0)

## 2023-02-05 MED ORDER — METRONIDAZOLE 500 MG PO TABS: 2000.0000 mg | ORAL_TABLET | ORAL | 0 refills | Status: AC

## 2023-02-05 NOTE — Patient Instructions (Signed)
Trichomoniasis Trichomoniasis is a sexually transmitted infection (STI). Many people with trichomoniasis do not have any symptoms (are asymptomatic) or have only minimal symptoms. Untreated trichomoniasis can last from months to years. This condition is treated with medicine. What are the causes? This condition is caused by a parasite called Trichomonas vaginalis and is transmitted during sexual contact. What increases the risk? The following factors may make you more likely to develop this condition: Having unprotected sex. Having sex with a partner who has trichomoniasis. Having multiple sexual partners. Having had previous trichomoniasis infections or other STIs. What are the signs or symptoms? In females, symptoms of trichomoniasis include: Itching, burning, redness, or soreness in the genital area. Discomfort while urinating. Abnormal vaginal discharge that is clear, white, gray, or yellow-green and foamy and has an unusual fishy odor. In males, symptoms of trichomoniasis include: Discharge from the penis. Burning after urination or ejaculation. Itching or discomfort inside the penis. How is this diagnosed? This condition is diagnosed based on tests. To perform a test, your health care provider will do one of the following: Ask you to provide a urine sample. Take a sample of discharge. The sample may be taken from the vagina or cervix in females and from the urethra in males. Your health care provider may use a swab to collect the sample. Your health care provider may test you for other STIs, including human immunodeficiency virus (HIV). How is this treated?  This condition is treated with medicines such as metronidazole or tinidazole. These are called antimicrobial medicines, and they are taken by mouth (orally). Your sexual partner or partners also need to be tested and treated. If they have the infection and are not treated, you will likely get reinfected. If you plan to become  pregnant or think you may be pregnant, tell your health care provider right away. Some medicines that are used to treat the infection should not be taken during pregnancy. Your health care provider may recommend over-the-counter medicines or creams to help relieve itching or irritation. You may be tested for the infection again 3 months after treatment. Follow these instructions at home: Medicines Take over-the-counter and prescription medicines only as told by your health care provider. Take your antimicrobial medicine as told by your health care provider. Do not stop taking it even if you start to feel better. Use creams as told by your health care provider. General instructions Do not have sex until after you finish your medicine and your symptoms have resolved. Do not wear tampons while you have the infection (if you are male). Talk with your sexual partner or partners about any symptoms that either of you may have, as well as any history of STIs. Keep all follow-up visits. This is important. How is this prevented?  Use condoms every time you have sex. Using condoms correctly and consistently can help protect against STIs. Do not have sexual contact if you have symptoms of trichomoniasis or another STI. Avoid having multiple sexual partners. Get tested for STIs before you have sex with a partner. Ask all partners to do the same. Do not douche (if you are male). Douching may increase your risk for getting STIs due to the removal of good bacteria in the vagina. Contact a health care provider if: You still have symptoms after you finish your medicine. You develop a rash. You plan to become pregnant or think you may be pregnant. Summary Trichomoniasis is a sexually transmitted infection (STI). This condition often has no symptoms or minimal  symptoms. Take your antimicrobial medicine as told by your health care provider. Do not stop taking even if you start to feel better. Discuss your  infection with your sexual partner or partners. Make sure that all partners get tested and treated, if necessary. You should not have sex until after you finish your medicine and your symptoms have resolved. Keep all follow-up visits. This is important. This information is not intended to replace advice given to you by your health care provider. Make sure you discuss any questions you have with your health care provider. Document Revised: 11/12/2021 Document Reviewed: 11/12/2021 Elsevier Patient Education  [employer].

## 2023-02-05 NOTE — Progress Notes (Signed)
Date:  02/05/2023   Name:  James Goodwin   DOB:  Jun 10, 1966   MRN:  [id number]   Chief Complaint: No chief complaint on file.  Patient is a 57 year old male who presents for a comprehensive physical exam. The patient reports the following problems: contact infection. Health maintenance has been reviewed up to date.      Lab Results  Component Value Date   NA 142 12/09/2015   K 4.5 12/09/2015   CO2 25 12/09/2015   GLUCOSE 83 12/09/2015   BUN 12 12/09/2015   CREATININE 1.03 12/09/2015   CALCIUM 9.4 12/09/2015   GFRNONAA 84 12/09/2015   Lab Results  Component Value Date   CHOL 120 12/09/2015   HDL 39 (L) 12/09/2015   LDLCALC 59 12/09/2015   TRIG 108 12/09/2015   CHOLHDL 3.1 12/09/2015   No results found for: "TSH" No results found for: "HGBA1C" No results found for: "WBC", "HGB", "HCT", "MCV", "PLT" No results found for: "ALT", "AST", "GGT", "ALKPHOS", "BILITOT" No results found for: "25OHVITD2", "25OHVITD3", "VD25OH"   Review of Systems  Genitourinary:  Negative for decreased urine volume, difficulty urinating, dysuria, frequency, penile discharge, scrotal swelling, testicular pain and urgency.    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  Current Meds  Medication Sig   clotrimazole-betamethasone (LOTRISONE) cream Apply topically 2 (two) times daily.   diphenhydrAMINE (BENADRYL) 25 mg capsule Take 25 mg  by mouth every 6 (six) hours as needed.   hydrOXYzine (ATARAX/VISTARIL) 10 MG tablet Take 1 tablet (10 mg total) by mouth 3 (three) times daily as needed.   mupirocin ointment (BACTROBAN) 2 % Apply 1 application topically 2 (two) times daily.   [DISCONTINUED] cephALEXin (KEFLEX) 500 MG capsule Take 1 capsule (500 mg total) by mouth 4 (four) times daily.       02/05/2023   11:10 AM  GAD 7 : Generalized Anxiety Score  Nervous, Anxious, on Edge 0  Control/stop worrying 0  Worry too much - different things 0  Trouble relaxing 0  Restless 0  Easily annoyed or irritable 0  Afraid - awful might happen 0  Total GAD 7 Score 0  Anxiety Difficulty Not difficult at all       02/05/2023   11:10 AM 04/29/2021   11:25 AM  Depression screen PHQ 2/9  Decreased Interest 0 0  Down, Depressed, Hopeless 0 0  PHQ - 2 Score 0 0  Altered sleeping 0   Tired, decreased energy 0   Change in appetite 0   Feeling bad or failure about yourself  0   Trouble concentrating 0   Moving slowly or fidgety/restless 0   Suicidal thoughts 0   PHQ-9 Score 0   Difficult doing work/chores Not difficult at all     BP Readings from Last 3 Encounters:  02/05/23 128/78  01/21/22 122/70  04/29/21 120/80    Physical Exam Vitals and nursing note reviewed.  HENT:     Head: Normocephalic.     Right Ear: Tympanic membrane and ear canal normal.     Left Ear: Tympanic membrane and ear canal normal.     Nose: Nose normal. No rhinorrhea.     Mouth/Throat:     Pharynx: No oropharyngeal exudate or posterior oropharyngeal erythema.  Eyes:     Pupils: Pupils are equal, round, and reactive to light.  Cardiovascular:     Rate and Rhythm: Normal rate.     Heart sounds: No murmur heard.    No friction rub. No gallop.  Pulmonary:     Breath sounds: No wheezing or rhonchi.  Musculoskeletal:     Cervical back: Normal range of motion.  Neurological:     Mental Status: He is alert.     Wt Readings from Last 3 Encounters:   02/05/23 174 lb (78.9 kg)  01/21/22 175 lb (79.4 kg)  04/29/21 173 lb (78.5 kg)    BP 128/78   Pulse 95   Ht 6' (1.829 m)   Wt 174 lb (78.9 kg)   SpO2 97%   BMI 23.60 kg/m   Assessment and Plan: 1. Urethritis Will exposure to trichomonas patient comes for evaluation and information.  Urinalysis notes only microscopic nonhemolyzed trace of erythrocytes which is consistent with his history of kidney stones.  We will go ahead and prophylactically treat with Flagyl 500 mg 2 g dose all at 1 time. - POCT urinalysis dipstick - metroNIDAZOLE (FLAGYL) 500 MG tablet; Take 4 tablets (2,000 mg total) by mouth 1 day or 1 dose for 1 dose.  Dispense: 4 tablet; Refill: 0     Otilio Miu, MD

## 2023-05-28 ENCOUNTER — Ambulatory Visit: Payer: 59 | Admitting: Family Medicine

## 2023-05-28 ENCOUNTER — Encounter: Payer: Self-pay | Admitting: Family Medicine

## 2023-05-28 ENCOUNTER — Ambulatory Visit: Admission: RE | Admit: 2023-05-28 | Payer: 59 | Source: Home / Self Care

## 2023-05-28 ENCOUNTER — Ambulatory Visit
Admission: RE | Admit: 2023-05-28 | Discharge: 2023-05-28 | Disposition: A | Discharge status: Home / Self Care | Payer: 59 | Source: Ambulatory Visit | Attending: Family Medicine | Admitting: Family Medicine

## 2023-05-28 VITALS — BP 124/76 | HR 90 | Ht 72.0 in | Wt 178.0 lb

## 2023-05-28 DIAGNOSIS — M79671 Pain in right foot: Secondary | ICD-10-CM | POA: Diagnosis not present

## 2023-05-28 DIAGNOSIS — M19071 Primary osteoarthritis, right ankle and foot: Secondary | ICD-10-CM | POA: Diagnosis not present

## 2023-05-28 MED ORDER — MELOXICAM 15 MG PO TABS: 15.0000 mg | ORAL_TABLET | Freq: Every day | ORAL | 0 refills | Status: DC

## 2023-05-28 NOTE — Progress Notes (Signed)
Date:  05/28/2023   Name:  James Goodwin   DOB:  October 19, 1966   MRN:  [id number]   Chief Complaint: Foot Pain (R) foot pain after missing a step 5 days ago. Came down hard flat-footed foot has been black and purple since. Hurts to put pressure when walking)  Foot Pain This is a new problem. The current episode started in the past 7 days. The problem occurs constantly. The problem has been unchanged. Pertinent negatives include no chest pain, joint swelling or weakness. Exacerbated by: weight bearing/missed a step. The treatment provided moderate relief.    Lab Results  Component Value Date   NA 142 12/09/2015   K 4.5 12/09/2015   CO2 25 12/09/2015   GLUCOSE 83 12/09/2015   BUN 12 12/09/2015   CREATININE 1.03 12/09/2015   CALCIUM 9.4 12/09/2015   GFRNONAA 84 12/09/2015   Lab Results  Component Value Date   CHOL 120 12/09/2015   HDL 39 (L) 12/09/2015   LDLCALC 59 12/09/2015   TRIG 108 12/09/2015   CHOLHDL 3.1 12/09/2015   No results found for: "TSH" No results found for: "HGBA1C" No results found for: "WBC", "HGB", "HCT", "MCV", "PLT" No results found for: "ALT", "AST", "GGT", "ALKPHOS", "BILITOT" No results found for: "25OHVITD2", "25OHVITD3", "VD25OH"   Review of Systems  Respiratory:  Negative for choking, shortness of breath and wheezing.   Cardiovascular:  Negative for chest pain, palpitations and leg swelling.  Musculoskeletal:  Negative for joint swelling.  Neurological:  Negative for weakness.    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  Current Meds  Medication Sig   clotrimazole-betamethasone (LOTRISONE) cream Apply topically 2 (two) times daily.   diphenhydrAMINE (BENADRYL) 25 mg capsule Take 25 mg by mouth every 6 (six) hours as needed.   hydrOXYzine (ATARAX/VISTARIL) 10 MG tablet Take 1 tablet (10 mg total) by mouth 3 (three) times daily as needed.   mupirocin ointment (BACTROBAN) 2 % Apply 1 application topically 2 (two) times daily.       05/28/2023    4:34 PM 02/05/2023   11:10 AM  GAD 7 : Generalized Anxiety Score  Nervous, Anxious, on Edge 0 0  Control/stop worrying 0 0  Worry too much - different things 0 0  Trouble relaxing 0 0  Restless 0 0  Easily annoyed or irritable 0 0  Afraid - awful might happen 0 0  Total GAD 7 Score 0 0  Anxiety Difficulty Not difficult at all Not difficult at all       05/28/2023    4:34 PM 02/05/2023   11:10 AM 04/29/2021   11:25 AM  Depression screen PHQ 2/9  Decreased Interest 0 0 0  Down, Depressed, Hopeless 0 0 0  PHQ - 2 Score 0 0 0  Altered sleeping 0 0   Tired, decreased  energy 0 0   Change in appetite 0 0   Feeling bad or failure about yourself  0 0   Trouble concentrating 0 0   Moving slowly or fidgety/restless 0 0   Suicidal thoughts 0 0   PHQ-9 Score 0 0   Difficult doing work/chores Not difficult at all Not difficult at all     BP Readings from Last 3 Encounters:  05/28/23 124/76  02/05/23 128/78  01/21/22 122/70    Physical Exam HENT:     Nose: No congestion.     Mouth/Throat:     Mouth: Mucous membranes are moist.  Cardiovascular:     Heart sounds: No murmur heard.    No friction rub. No gallop.  Pulmonary:     Breath sounds: No wheezing, rhonchi or rales.  Neurological:     Mental Status: He is alert.     Wt Readings from Last 3 Encounters:  05/28/23 178 lb (80.7 kg)  02/05/23 174 lb (78.9 kg)   01/21/22 175 lb (79.4 kg)    BP 124/76   Pulse 90   Ht 6' (1.829 m)   Wt 178 lb (80.7 kg)   SpO2 96%   BMI 24.14 kg/m   Assessment and Plan: 1. Acute foot pain, right New onset.  Status post misstep and the forefoot fell over the step and forcefully hit the floor.  Tenderness is noted of the first MP joint and bruising was noted.  There is full range of motion but some pain with plantarflexion of the great toe we will initiate meloxicam 15 mg once a day and obtain an x-ray to rule out fracture. - meloxicam (MOBIC) 15 MG tablet; Take 1 tablet (15 mg total) by mouth daily.  Dispense: 30 tablet; Refill: 0 - DG Foot Complete Right     Elizabeth Sauer, MD

## 2023-06-24 ENCOUNTER — Other Ambulatory Visit: Payer: Self-pay | Admitting: Family Medicine

## 2023-06-24 DIAGNOSIS — M79671 Pain in right foot: Secondary | ICD-10-CM

## 2023-06-25 NOTE — Telephone Encounter (Signed)
Requested medication (s) are due for refill today: yes  Requested medication (s) are on the active medication list: yes  Last refill:  05/28/23  Future visit scheduled: no  Notes to clinic:  Unable to refill per protocol due to failed labs, no updated results.      Requested Prescriptions  Pending Prescriptions Disp Refills   meloxicam (MOBIC) 15 MG tablet [Pharmacy Med Name: MELOXICAM 15MG  TABLETS] 30 tablet 0    Sig: TAKE 1 TABLET(15 MG) BY MOUTH DAILY     Analgesics:  COX2 Inhibitors Failed - 06/24/2023  3:34 AM      Failed - Manual Review: Labs are only required if the patient has taken medication for more than 8 weeks.      Failed - HGB in normal range and within 360 days    No results found for: "HGB", "HGBKUC", "HGBPOCKUC", "HGBOTHER", "TOTHGB", "HGBPLASMA"       Failed - Cr in normal range and within 360 days    Creatinine, Ser  Date Value Ref Range Status  12/09/2015 1.03 0.76 - 1.27 mg/dL Final         Failed - HCT in normal range and within 360 days    No results found for: "HCT", "HCTKUC", "SRHCT"       Failed - AST in normal range and within 360 days    No results found for: "POCAST", "AST"       Failed - ALT in normal range and within 360 days    No results found for: "ALT", "LABALT", "POCALT"       Failed - eGFR is 30 or above and within 360 days    GFR calc Af Amer  Date Value Ref Range Status  12/09/2015 97 >59 mL/min/1.73 Final   GFR calc non Af Amer  Date Value Ref Range Status  12/09/2015 84 >59 mL/min/1.73 Final         Passed - Patient is not pregnant      Passed - Valid encounter within last 12 months    Recent Outpatient Visits           4 weeks ago Acute foot pain, right   [place] [facility]   4 months ago Urethritis   [facility]   1 year ago Folliculitis barbae   [place] [facility]   2 years ago Allergic contact dermatitis due to dyes   [facility]   6 years ago Right knee pain, unspecified chronicity   [place] [facility]

## 2023-06-29 ENCOUNTER — Ambulatory Visit: Payer: Self-pay | Admitting: *Deleted

## 2023-06-29 NOTE — Telephone Encounter (Signed)
Summary: Foot is still swollen   Meloxicam not helping, foot is still swollen. Still hurts. Finished last pill today  Best contact: [phone]         Chief Complaint: right foot pain and swelling persist. Requesting medication Symptoms: right foot swelling chronic. Pain not gone. Meloxicam not effective. Last dose today . Can not wear shoes he wants due to swelling Frequency: since 05/28/23 Pertinent Negatives: Patient denies severe pain no swelling up leg Disposition: [] ED /[] Urgent Care (no appt availability in office) / [x] Appointment(In office/virtual)/ []  [facility]/ [] Home Care/ [] Refused Recommended Disposition /[] [facility]/ []  Follow-up with PCP Additional Notes:   Requesting another kind of medication for foot pain . Meloxicam not effective. Finished last dose of meloxicam today .requesting alternative medication and please send medications to [facility] in [place] due to insurance not recognizing previous pharmacy. Would like another medication until OV 07/02/23. Please advise.       Reason for Disposition  Foot pain is a chronic symptom (recurrent or ongoing AND present > 4 weeks)  Answer Assessment - Initial Assessment Questions 1. ONSET: "When did the pain start?"      Ongoing since last OV 05/28/23 2. LOCATION: "Where is the pain located?"      Right foot  3. PAIN: "How bad is the pain?"    (Scale 1-10; or mild, moderate, severe)  - MILD (1-3): doesn't interfere with normal activities.   - MODERATE (4-7): interferes with normal activities (e.g., work or school) or awakens from sleep, limping.   - SEVERE (8-10): excruciating pain, unable to do any normal activities, unable to walk.      Continues with pain and swelling right foot.  4. WORK OR EXERCISE: "Has there been any recent work or exercise that involved this part of the body?"      na 5. CAUSE: "What do you think is causing the foot pain?"     na 6. OTHER SYMPTOMS: "Do you have any  other symptoms?" (e.g., leg pain, rash, fever, numbness)     Right foot still swollen. Can not wear shoes he wants to wear  7. PREGNANCY: "Is there any chance you are pregnant?" "When was your last menstrual period?"     na  Protocols used: Foot Pain-A-AH

## 2023-06-30 NOTE — Telephone Encounter (Signed)
Pt has an appt for 7/5.  KP

## 2023-07-02 ENCOUNTER — Encounter: Payer: Self-pay | Admitting: Family Medicine

## 2023-07-02 ENCOUNTER — Ambulatory Visit: Payer: 59 | Admitting: Family Medicine

## 2023-07-02 VITALS — BP 130/76 | HR 86 | Ht 72.0 in | Wt 175.0 lb

## 2023-07-02 DIAGNOSIS — M25871 Other specified joint disorders, right ankle and foot: Secondary | ICD-10-CM | POA: Diagnosis not present

## 2023-07-02 MED ORDER — CEPHALEXIN 500 MG PO CAPS: 500.0000 mg | ORAL_CAPSULE | Freq: Four times a day (QID) | ORAL | 0 refills | Status: DC

## 2023-07-02 MED ORDER — DICLOFENAC SODIUM 75 MG PO TBEC: 75.0000 mg | DELAYED_RELEASE_TABLET | Freq: Two times a day (BID) | ORAL | 0 refills | Status: DC

## 2023-07-02 NOTE — Patient Instructions (Signed)
Sesamoid Injury  A sesamoid injury happens when a sesamoid bone or a surrounding tendon gets damaged during activity. A sesamoid bone is a bone that is connected to a tendon or a muscle, but not to a joint. There are sesamoid bones in your hands, knees, and feet. Your kneecap is an example of a sesamoid bone. Sesamoid injuries may include irritation, dislocation, or a break (fracture) in a sesamoid bone. The most common sesamoid injuries affect the sesamoid bones under the big toe. These bones help you move forward during weight-bearing activities. What are the causes? This condition is caused by damage to a sesamoid bone or a surrounding tendon. What increases the risk? This condition is more likely to develop in people who: Dance and Archivist. Run. Play sports. Are active on artificial turf. Wear high heels. Have an injury to the thumb. What are the signs or symptoms? Symptoms of this condition include: Pain in the affected hand, knee, or foot. Pain when you try to straighten (extend) or bend (flex) the affected toe, knee, or finger. A popping sound that happens at the time of injury. Swelling. Bruising. How is this diagnosed? This condition is diagnosed with: A physical exam. Observation of your movement while you walk. An X-ray. Bone scans. How is this treated? Treatment for this condition depends on the location, type, and severity of the injury. Treatment may include: Resting the affected area and avoiding activities that are causing injury. Applying ice to the affected area. Taking over-the-counter pain medicine. Placing a cushioned pad in the shoe of the affected foot. Taping the affected finger or toe to prevent movement. Getting steroid injections. Wearing a cast, brace, or orthotic shoe. Doing physical therapy. Surgery may be needed if other treatments do not work. Follow these instructions at home: If you have a cast: Do not put pressure on any part of the  cast until it is fully hardened. This may take several hours. Do not stick anything inside the cast to scratch your skin. Doing that increases your risk of infection. Check the skin around it every day. Tell your health care provider about any concerns. You may put lotion on dry skin around the edges of the cast. Do not put lotion on the skin underneath it. Keep it clean and dry. If you have a brace or orthotic shoe: Wear it as told by your health care provider. Remove it only as told by your health care provider. Loosen it if your fingers or toes tingle, become numb, or turn cold and blue. Keep it clean and dry. Bathing Do not take baths, swim, or use a hot tub until your health care provider approves. Ask your health care provider if you may take showers. You may only be allowed to take sponge baths. If the cast, brace, or orthotic shoe is not waterproof: Do not let it get wet. Cover it with a watertight covering when you take a bath or shower. Managing pain, stiffness, and swelling  If directed, put ice on the injured area. To do this: If you have a removable brace or orthotic shoe, remove it as told by your health care provider. Put ice in a plastic bag. Place a towel between your skin and the bag or between your cast and the bag. Leave the ice on for 20 minutes, 2-3 times a day. Remove the ice if your skin turns bright red. This is very important. If you cannot feel pain, heat, or cold, you have a greater risk of  damage to the area. Move your fingers or toes often to reduce stiffness and swelling. Raise (elevate) the injured area above the level of your heart while you are sitting or lying down. Driving Ask your health care provider if the medicine prescribed to you requires you to avoid driving or using heavy machinery. Ask your health care provider when it is safe to drive if you have a cast, brace, or orthotic shoe on a foot that you use for driving. Activity Do not use the  injured limb to support your body weight until your health care provider says that you can. Use crutches as told by your health care provider. Do exercises as told by your health care provider or physical therapist. Return to your normal activities as told by your health care provider. Ask your health care provider what activities are safe for you. General instructions Take over-the-counter and prescription medicines only as told by your health care provider. Do not use any products that contain nicotine or tobacco before the procedure. These products include cigarettes, chewing tobacco, and vaping devices, such as e-cigarettes. If you need help quitting, ask your health care provider. Keep all follow-up visits. This is important. Contact a health care provider if: You have pain and swelling that continues, even with treatment. Your pain and swelling returns after you get back to your normal activities. You cannot put pressure on your foot. Get help right away if: You lose sensation in the affected area. Your fingers or toes turn cold and blue. Summary A sesamoid injury happens when a sesamoid bone or a surrounding tendon gets damaged during activity. Symptoms of this condition include pain in the affected area, a popping sound at the time of injury, swelling, and bruising. Treatment for this condition depends on the location, type, and severity of the injury. This information is not intended to replace advice given to you by your health care provider. Make sure you discuss any questions you have with your health care provider. Document Revised: 08/18/2021 Document Reviewed: 08/18/2021 Elsevier Patient Education  2024 [employer].

## 2023-07-02 NOTE — Progress Notes (Signed)
Date:  07/02/2023   Name:  James Goodwin   DOB:  1966-11-26   MRN:  [id number]   Chief Complaint: Foot Pain  Foot Pain This is a new problem. The current episode started more than 1 month ago (5 week). The problem occurs constantly. The problem has been waxing and waning. Pertinent negatives include no chills or fever. Exacerbated by: wearing shoes/weight bearing. He has tried NSAIDs for the symptoms. The treatment provided mild (pain improved but swel;ling persists) relief.    Lab Results  Component Value Date   NA 142 12/09/2015   K 4.5 12/09/2015   CO2 25 12/09/2015   GLUCOSE 83 12/09/2015   BUN 12 12/09/2015   CREATININE 1.03 12/09/2015   CALCIUM 9.4 12/09/2015   GFRNONAA 84 12/09/2015   Lab Results  Component Value Date   CHOL 120 12/09/2015   HDL 39 (L) 12/09/2015   LDLCALC 59 12/09/2015   TRIG 108 12/09/2015   CHOLHDL 3.1 12/09/2015   No results found for: "TSH" No results found for: "HGBA1C" No results found for: "WBC", "HGB", "HCT", "MCV", "PLT" No results found for: "ALT", "AST", "GGT", "ALKPHOS", "BILITOT" No results found for: "25OHVITD2", "25OHVITD3", "VD25OH"   Review of Systems  Constitutional:  Negative for chills, fever and unexpected weight change.    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  Current Meds  Medication Sig   clotrimazole-betamethasone (LOTRISONE) cream Apply topically 2  (two) times daily.   diphenhydrAMINE (BENADRYL) 25 mg capsule Take 25 mg by mouth every 6 (six) hours as needed.   hydrOXYzine (ATARAX/VISTARIL) 10 MG tablet Take 1 tablet (10 mg total) by mouth 3 (three) times daily as needed.   mupirocin ointment (BACTROBAN) 2 % Apply 1 application topically 2 (two) times daily.       05/28/2023    4:34 PM 02/05/2023   11:10 AM  GAD 7 : Generalized Anxiety Score  Nervous, Anxious, on Edge 0 0  Control/stop worrying 0 0  Worry too much - different things 0 0  Trouble relaxing 0 0  Restless 0 0  Easily annoyed or irritable 0 0  Afraid - awful might happen 0 0  Total GAD 7 Score 0 0  Anxiety Difficulty Not difficult at all Not difficult at all       05/28/2023    4:34 PM 02/05/2023   11:10 AM 04/29/2021   11:25 AM  Depression screen PHQ 2/9  Decreased Interest 0 0 0  Down, Depressed, Hopeless 0 0 0  PHQ - 2 Score 0 0 0  Altered sleeping 0 0   Tired, decreased energy 0 0   Change in appetite 0 0   Feeling bad or failure about yourself  0 0   Trouble concentrating 0 0   Moving slowly or fidgety/restless 0 0   Suicidal thoughts 0  0   PHQ-9 Score 0 0   Difficult doing work/chores Not difficult at all Not difficult at all     BP Readings from Last 3 Encounters:  07/02/23 130/76  05/28/23 124/76  02/05/23 128/78    Physical Exam HENT:     Right Ear: Tympanic membrane, ear canal and external ear normal.     Left Ear: Tympanic membrane, ear canal and external ear normal.  Cardiovascular:     Heart sounds: No murmur heard.    No gallop.  Pulmonary:     Breath sounds: No wheezing, rhonchi or rales.  Musculoskeletal:     Right foot: Swelling present.     Comments: Swelling/ tenderness first metatarsal phalageal pad     Wt Readings from Last 3 Encounters:  07/02/23 175 lb (79.4 kg)  05/28/23 178 lb (80.7 kg)  02/05/23 174 lb (78.9 kg)    BP 130/76   Pulse 86   Ht 6' (1.829 m)   Wt 175 lb (79.4 kg)   SpO2 98%   BMI 23.73 kg/m    Assessment and Plan:  1. Sesamoiditis of right foot New onset.  Sustained an injury about 5 weeks ago.  Pain has decreased but swelling has remained and perhaps worsened.  There is swelling with some tenderness over the sesamoid at the plantar aspect of the first metatarsal.  We will repeat an x-ray to look at the first MP joint and corresponding tissue.  With the remote possibility of cellulitis we will start cephalexin we will discontinue the meloxicam and switch over to Voltaren 75 mg twice a day we will obtain an x-ray of the foot to see if there is been any latent fracture that she has showed up or perhaps visualizing the sesamoid bone seen in injury and referred to podiatry for evaluation.  The remote possibility that this may be gout related even though tenderness is not that exquisite we will check a uric acid. - cephALEXin (KEFLEX) 500 MG capsule; Take 1 capsule (500 mg total) by mouth 4 (four) times daily.  Dispense: 30 capsule; Refill: 0 - diclofenac (VOLTAREN) 75 MG EC tablet; Take 1 tablet (75 mg total) by mouth 2 (two) times daily.  Dispense: 30 tablet; Refill: 0 - Ambulatory referral to Podiatry - Uric acid - DG Foot Complete Right    Elizabeth Sauer, MD

## 2023-07-13 DIAGNOSIS — M25871 Other specified joint disorders, right ankle and foot: Secondary | ICD-10-CM | POA: Diagnosis not present

## 2023-07-14 LAB — URIC ACID: Uric Acid: 4.8 mg/dL (ref 3.8–8.4)

## 2023-07-26 ENCOUNTER — Ambulatory Visit: Payer: 59 | Admitting: Podiatry

## 2023-07-26 ENCOUNTER — Ambulatory Visit (INDEPENDENT_AMBULATORY_CARE_PROVIDER_SITE_OTHER): Payer: 59

## 2023-07-26 ENCOUNTER — Encounter: Payer: Self-pay | Admitting: Podiatry

## 2023-07-26 DIAGNOSIS — M25871 Other specified joint disorders, right ankle and foot: Secondary | ICD-10-CM

## 2023-07-26 DIAGNOSIS — M25872 Other specified joint disorders, left ankle and foot: Secondary | ICD-10-CM | POA: Diagnosis not present

## 2023-07-26 DIAGNOSIS — S93521A Sprain of metatarsophalangeal joint of right great toe, initial encounter: Secondary | ICD-10-CM

## 2023-07-26 MED ORDER — METHYLPREDNISOLONE 4 MG PO TBPK: ORAL_TABLET | ORAL | 0 refills | Status: DC

## 2023-07-26 MED ORDER — IBUPROFEN 800 MG PO TABS: 800.0000 mg | ORAL_TABLET | Freq: Three times a day (TID) | ORAL | 2 refills | Status: AC | PRN

## 2023-07-26 NOTE — Progress Notes (Signed)
  Subjective:  Patient ID: James Goodwin, male    DOB: [birth year],  MRN: [id number]  Chief Complaint  Patient presents with   Foot Pain    "The bottom of my foot has been sore." N - pain in bottom of my foot L - 1st met plantar right D - 2 mos O - suddenly, a little better C - throbbing, sore, swelling A - pressure T - went to PCP, prescribed Diclofenac, Meloxicam, Cephalexin, wear a flip flop    57 y.o. male presents with the above complaint. History confirmed with patient.  Developed while he was ascending stairs and landed hard on the foot.  Was treated by his PCP placed on 2 different anti-inflammatories, meloxicam did not think the diclofenac helped a little more.  They also ordered lab work to evaluate for gout which was negative.  Has not had any immobilization.  Objective:  Physical Exam: warm, good capillary refill, no trophic changes or ulcerative lesions, normal DP and PT pulses, normal sensory exam, and right foot pain and tenderness in the plantar first MPJ and the sesamoid complex does not isolate to a single sesamoid, worse with dorsiflexion of the hallux   Radiographs: Multiple views x-ray of the right foot: no fracture, dislocation, swelling or degenerative changes noted Assessment:   1. Sesamoiditis of right foot   2. Turf toe of right foot      Plan:  Patient was evaluated and treated and all questions answered.  We reviewed his radiographs taken today as well as the ones taken by his PCP previously earlier this year.  Has not had much immobilization.  His lab work was negative for gout.  Anti-inflammatories have had some mixed results, I recommended switching to ibuprofen which may offer a faster and more significant pain relief and he can take this more often.  I did recommend immobilization and rest to allow the soft tissues to heal suspect likely he has a turf toe type injury.  If no improvement in 1 month would recommend MRI.  Methylprednisolone taper  also sent to pharmacy.  Return in about 1 month (around 08/26/2023) for follow up turf toe / sesamoiditis R .

## 2023-07-29 NOTE — Progress Notes (Signed)
Scheduled pt in [facility] 8/28.

## 2023-08-25 ENCOUNTER — Ambulatory Visit: Payer: 59 | Admitting: Podiatry

## 2023-08-25 ENCOUNTER — Encounter: Payer: Self-pay | Admitting: Podiatry

## 2023-08-25 DIAGNOSIS — S93521A Sprain of metatarsophalangeal joint of right great toe, initial encounter: Secondary | ICD-10-CM

## 2023-08-25 DIAGNOSIS — M25871 Other specified joint disorders, right ankle and foot: Secondary | ICD-10-CM

## 2023-08-26 ENCOUNTER — Encounter: Payer: Self-pay | Admitting: Podiatry

## 2023-08-26 ENCOUNTER — Ambulatory Visit: Payer: 59 | Admitting: Podiatry

## 2023-08-26 NOTE — Progress Notes (Signed)
  Subjective:  Patient ID: James Goodwin, male    DOB: [birth year],  MRN: [id number]  Chief Complaint  Patient presents with   Foot Pain    "It's gotten better but it still has a little redness in the toe."    57 y.o. male presents with the above complaint. History confirmed with patient.  Doing much better the medications and immobilization has helped.  Objective:  Physical Exam: warm, good capillary refill, no trophic changes or ulcerative lesions, normal DP and PT pulses, normal sensory exam, and right foot today shows good range of motion of the MTPJ with no pain on the sesamoids or joint range of motion  Radiographs: Multiple views x-ray of the right foot: no fracture, dislocation, swelling or degenerative changes noted Assessment:   1. Sesamoiditis of right foot   2. Turf toe of right foot      Plan:  Patient was evaluated and treated and all questions answered.  Doing much better and seems to have resolved at this point with immobilization and anti-inflammatories.  He may continue anti-inflammatories as needed.  He may gradually increase his activity level we discussed appropriate shoe gear to support the injury.  In 1 month should be able to be full activity with any shoe.  Return to see me as needed if this returns or worsens.  No follow-ups on file.

## 2023-09-01 ENCOUNTER — Ambulatory Visit: Payer: 59 | Admitting: Podiatry

## 2023-09-21 ENCOUNTER — Encounter: Payer: Self-pay | Admitting: Podiatry

## 2023-09-21 ENCOUNTER — Other Ambulatory Visit: Payer: Self-pay | Admitting: Podiatry

## 2023-09-21 MED ORDER — METHYLPREDNISOLONE 4 MG PO TBPK: ORAL_TABLET | ORAL | 0 refills | Status: DC

## 2023-09-21 NOTE — Progress Notes (Signed)
Rx for medrol dose pack sent

## 2023-11-03 ENCOUNTER — Ambulatory Visit (INDEPENDENT_AMBULATORY_CARE_PROVIDER_SITE_OTHER): Payer: 59 | Admitting: Podiatry

## 2023-11-03 DIAGNOSIS — Z91199 Patient's noncompliance with other medical treatment and regimen due to unspecified reason: Secondary | ICD-10-CM

## 2023-11-05 ENCOUNTER — Encounter: Payer: Self-pay | Admitting: Podiatry

## 2023-11-05 NOTE — Progress Notes (Signed)
Patient was no-show for appointment today 

## 2024-11-27 ENCOUNTER — Telehealth: Payer: Self-pay

## 2024-11-27 NOTE — Telephone Encounter (Signed)
 Copied from CRM [id number]. Topic: Appointments - Transfer of Care >> Nov 27, 2024  2:13 PM Corin V wrote: Pt is requesting to transfer FROM: Dr. Joshua Pt is requesting to transfer TO: Dr. Sol Reason for requested transfer: Dr. Joshua left It is the responsibility of the team the patient would like to transfer to (Dr. Sol) to reach out to the patient if for any reason this transfer is not acceptable.

## 2024-11-27 NOTE — Telephone Encounter (Signed)
 Pt has appt with Dr. Sol 12/07/24.  KP

## 2024-12-07 ENCOUNTER — Encounter: Payer: Self-pay | Admitting: Family Medicine

## 2024-12-07 ENCOUNTER — Ambulatory Visit: Admitting: Family Medicine

## 2024-12-07 VITALS — BP 126/84 | HR 98 | Ht 72.0 in | Wt 172.0 lb

## 2024-12-07 DIAGNOSIS — Z125 Encounter for screening for malignant neoplasm of prostate: Secondary | ICD-10-CM | POA: Diagnosis not present

## 2024-12-07 DIAGNOSIS — Z1212 Encounter for screening for malignant neoplasm of rectum: Secondary | ICD-10-CM | POA: Diagnosis not present

## 2024-12-07 DIAGNOSIS — Z23 Encounter for immunization: Secondary | ICD-10-CM | POA: Diagnosis not present

## 2024-12-07 DIAGNOSIS — Z13220 Encounter for screening for lipoid disorders: Secondary | ICD-10-CM | POA: Diagnosis not present

## 2024-12-07 DIAGNOSIS — Z130 Encounter for screening for diseases of the blood and blood-forming organs and certain disorders involving the immune mechanism: Secondary | ICD-10-CM

## 2024-12-07 DIAGNOSIS — Z1211 Encounter for screening for malignant neoplasm of colon: Secondary | ICD-10-CM

## 2024-12-07 DIAGNOSIS — Z1159 Encounter for screening for other viral diseases: Secondary | ICD-10-CM | POA: Diagnosis not present

## 2024-12-07 DIAGNOSIS — Z131 Encounter for screening for diabetes mellitus: Secondary | ICD-10-CM | POA: Diagnosis not present

## 2024-12-07 DIAGNOSIS — Z136 Encounter for screening for cardiovascular disorders: Secondary | ICD-10-CM | POA: Diagnosis not present

## 2024-12-07 DIAGNOSIS — Z0000 Encounter for general adult medical examination without abnormal findings: Secondary | ICD-10-CM

## 2024-12-07 NOTE — Progress Notes (Signed)
 Established Patient Office Visit  Patient ID: James Goodwin, male    DOB: 1966/11/11  Age: 58 y.o. MRN: [id number] PCP: Ashantia Amaral K, MD  Chief Complaint  Patient presents with   Establish Care    Subjective:     HPI  Discussed the use of AI scribe software for clinical note transcription with the patient, who gave verbal consent to proceed.  History of Present Illness James Goodwin is a 58 year old male who presents for a routine follow-up and to establish care with a new provider.  He has no current health issues and is not on any medications. No recent urgent care or ER visits. No mental health problems. No use of alcohol, drugs, or smoking.  He is considering options for colorectal cancer screening and has not had a colonoscopy yet. He had a tooth extraction in January 2025 with no ongoing dental issues.  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 He is married with one son who is 11 years old.  He does not drink coffee.   {History (Optional):23778 Review of Systems  All other systems reviewed and are negative.     Objective:     BP 126/84   Pulse 98   Ht 6' (1.829 m)   Wt 172 lb (78 kg)   SpO2 95%   BMI 23.33 kg/m     Physical Exam Vitals and nursing note reviewed.  Constitutional:      Appearance: Normal appearance.  HENT:     Head: Normocephalic.     Right Ear: External ear normal.     Left Ear: External ear normal.  Eyes:     Conjunctiva/sclera: Conjunctivae normal.  Cardiovascular:     Rate and Rhythm: Normal rate.  Pulmonary:     Effort: Pulmonary effort is normal. No respiratory distress.  Abdominal:     Palpations: Abdomen is soft.  Musculoskeletal:        General: Normal range of motion.  Skin:    General: Skin is warm.  Neurological:     Mental Status: He is alert and oriented to person, place, and time.  Psychiatric:        Mood and Affect: Mood normal.      Physical Exam CARDIOVASCULAR: Heart rate elevated, blood pressure normal.   No results found for any visits on 12/07/24.     The ASCVD Risk score (Arnett DK, et al., 2019) failed to calculate for the following reasons:   Cannot find a previous HDL lab   Cannot find a previous total cholesterol lab   * - Cholesterol units were assumed    Assessment & Plan:   Problem List Items Addressed This Visit   None Visit Diagnoses       Annual physical exam    -  Primary     Encounter for lipid screening for cardiovascular disease       Relevant Orders   Lipid panel     Diabetes mellitus screening       Relevant Orders   Comprehensive metabolic panel with GFR   Hemoglobin A1c     Screening for iron deficiency anemia       Relevant Orders   CBC with Differential/Platelet     Encounter for hepatitis C screening test for low risk patient       Relevant Orders   HCV Ab w Reflex to Quant PCR     Encounter  for colorectal cancer screening using Cologuard test       Relevant Orders   Cologuard     Screening for prostate cancer       Relevant Orders   PSA       Assessment and Plan Assessment & Plan General Health Maintenance 58 year old male with no current health issues. Due for colon cancer screening and vaccinations. - Ordered blood work with fasting instructions. - Recommended Cologuard for colon cancer screening, follow-up colonoscopy if abnormal. - Administered tetanus and pneumonia vaccines. - Advised to obtain shingles vaccine at local pharmacy. - Scheduled one-year physical examination.    No follow-ups on file.    Vinary K Sonal Dorwart, MD [facility] at [facility]

## 2025-12-10 ENCOUNTER — Encounter: Admitting: Family Medicine
